# Patient Record
Sex: Female | Born: 1995 | Race: Asian | Hispanic: No | Marital: Single | Smoking: Never smoker
Health system: Southern US, Community
[De-identification: ages and names within clinical notes are randomized; demographics above are authoritative.]

## PROBLEM LIST (undated history)

## (undated) DIAGNOSIS — F32A Depression, unspecified: Secondary | ICD-10-CM

## (undated) DIAGNOSIS — J45909 Unspecified asthma, uncomplicated: Secondary | ICD-10-CM

## (undated) DIAGNOSIS — F419 Anxiety disorder, unspecified: Secondary | ICD-10-CM

## (undated) DIAGNOSIS — E282 Polycystic ovarian syndrome: Secondary | ICD-10-CM

## (undated) DIAGNOSIS — G4730 Sleep apnea, unspecified: Secondary | ICD-10-CM

## (undated) HISTORY — PX: HYMENECTOMY: SHX987

## (undated) HISTORY — DX: Unspecified asthma, uncomplicated: J45.909

## (undated) HISTORY — DX: Sleep apnea, unspecified: G47.30

## (undated) HISTORY — PX: WISDOM TOOTH EXTRACTION: SHX21

---
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
## 2020-01-11 ENCOUNTER — Ambulatory Visit: Payer: Self-pay

## 2020-01-11 ENCOUNTER — Ambulatory Visit
Admission: RE | Admit: 2020-01-11 | Discharge: 2020-01-11 | Disposition: A | Discharge status: Home / Self Care | Payer: Managed Care, Other (non HMO) | Source: Ambulatory Visit

## 2020-01-11 ENCOUNTER — Other Ambulatory Visit: Payer: Self-pay

## 2020-01-11 VITALS — BP 133/93 | HR 98 | Temp 98.3°F | Resp 16

## 2020-01-11 DIAGNOSIS — R21 Rash and other nonspecific skin eruption: Secondary | ICD-10-CM | POA: Diagnosis not present

## 2020-01-11 HISTORY — DX: Polycystic ovarian syndrome: E28.2

## 2020-01-11 MED ORDER — HYDROXYZINE HCL 25 MG PO TABS
25.0000 mg | ORAL_TABLET | Freq: Four times a day (QID) | ORAL | 0 refills | Status: DC
Start: 2020-01-11 — End: 2020-04-05

## 2020-01-11 MED ORDER — HYDROXYZINE HCL 25 MG PO TABS
25.0000 mg | ORAL_TABLET | Freq: Four times a day (QID) | ORAL | 0 refills | Status: DC
Start: 2020-01-11 — End: 2020-01-11

## 2020-01-11 NOTE — Discharge Instructions (Signed)
Continue prednisone as directed. Hydroxyzine as directed. Continue to monitor symptoms. If swelling to the lips worsen, have trouble breathing, go to the ED for further evaluation.

## 2020-01-11 NOTE — ED Provider Notes (Signed)
[facility]    CSN: [id number] Arrival date & time: 01/11/20  1745      History   Chief Complaint Chief Complaint  Patient presents with  . Rash    HPI Carol Manning is a 24 y.o. female.   24 year old female comes in for 1 day history of rash.  This for started to the back, itching and sensation.  Also noted lip swelling that has since improved.  Denies any pain, burning, erythema, warmth.  Denies fever, chills, body aches.  Denies URI symptoms, joint pain, nausea, vomiting, tick bites.  Patient states to the e-visit, was given prednisone pack.  However, since starting prednisone, noticed rash to the face, and therefore came in for evaluation.  Denies swelling of the throat, tripoding, drooling, trismus.  Denies shortness of breath. No obvious new exposures.      Past Medical History:  Diagnosis Date  . PCOS (polycystic ovarian syndrome)     There are no problems to display for this patient.   History reviewed. No pertinent surgical history.  OB History   No obstetric history on file.      Home Medications    Prior to Admission medications   Medication Sig Start Date End Date Taking? Authorizing Provider  drospirenone-ethinyl estradiol (YAZ) 3-0.02 MG tablet Take 1 tablet by mouth daily.   Yes [provider]  FLUoxetine (PROZAC) 20 MG tablet Take 20 mg by mouth daily.   Yes [provider]  metFORMIN (GLUCOPHAGE) 500 MG tablet Take 500 mg by mouth 2 (two) times daily with a meal.   Yes [provider]  predniSONE (STERAPRED UNI-PAK 21 TAB) 10 MG (21) TBPK tablet Take 10 mg by mouth daily.   Yes [provider]  hydrOXYzine (ATARAX/VISTARIL) 25 MG tablet Take 1 tablet (25 mg total) by mouth every 6 (six) hours. 01/11/20   Belinda Fisher, PA-C    Family History Family History  Adopted: Yes    Social History Social History   Tobacco Use  . Smoking status: Never Smoker  . Smokeless tobacco: Never Used  Vaping Use  .  Vaping Use: Never used  Substance Use Topics  . Alcohol use: Never  . Drug use: Never     Allergies   Patient has no known allergies.   Review of Systems Review of Systems  Reason unable to perform ROS: See HPI as above.     Physical Exam Triage Vital Signs ED Triage Vitals [01/11/20 1759]  Enc Vitals Group     BP (!) 133/93     Pulse Rate 98     Resp 16     Temp 98.3 F (36.8 C)     Temp Source Oral     SpO2 96 %     Weight      Height      Head Circumference      Peak Flow      Pain Score      Pain Loc      Pain Edu?      Excl. in GC?    No data found.  Updated Vital Signs BP (!) 133/93 (BP Location: Left Arm)   Pulse 98   Temp 98.3 F (36.8 C) (Oral)   Resp 16   LMP 07/07/2019 (Approximate) Comment: continuous bcp  SpO2 96%   Physical Exam Constitutional:      General: She is not in acute distress.    Appearance: Normal appearance. She is well-developed. She is not  toxic-appearing or diaphoretic.  HENT:     Head: Normocephalic and atraumatic.     Mouth/Throat:     Comments: Slight upper lip swelling.  Patient handling own secretions well. Eyes:     Conjunctiva/sclera: Conjunctivae normal.     Pupils: Pupils are equal, round, and reactive to light.  Pulmonary:     Effort: Pulmonary effort is normal. No respiratory distress.  Musculoskeletal:     Cervical back: Normal range of motion and neck supple.  Skin:    General: Skin is warm and dry.     Comments: Maculopapular rash diffusely of the lower back. No erythema, warmth.   Macular rash in slight circular pattern with central clearing to the left cheek.   Neurological:     Mental Status: She is alert and oriented to person, place, and time.      UC Treatments / Results  Labs (all labs ordered are listed, but only abnormal results are displayed) Labs Reviewed - No data to display  EKG   Radiology No results found.  Procedures Procedures (including critical care time)  Medications  Ordered in UC Medications - No data to display  Initial Impression / Assessment and Plan / UC Course  I have reviewed the triage vital signs and the nursing notes.  Pertinent labs & imaging results that were available during my care of the patient were reviewed by me and considered in my medical decision making (see chart for details).    Patient to continue prednisone as directed.  We will add hydroxyzine for itching.  Return precautions given.  Patient expresses understanding and agrees to plan.  Final Clinical Impressions(s) / UC Diagnoses   Final diagnoses:  Rash and nonspecific skin eruption    ED Prescriptions    Medication Sig Dispense Auth. Provider   hydrOXYzine (ATARAX/VISTARIL) 25 MG tablet  (Status: Discontinued) Take 1 tablet (25 mg total) by mouth every 6 (six) hours. 12 tablet Benoit Meech V, PA-C   hydrOXYzine (ATARAX/VISTARIL) 25 MG tablet Take 1 tablet (25 mg total) by mouth every 6 (six) hours. 12 tablet Belinda Fisher, PA-C     PDMP not reviewed this encounter.   Belinda Fisher, PA-C 01/12/20 0800

## 2020-01-11 NOTE — ED Triage Notes (Signed)
Pt c/o rash to trunk, arms, legs acute onset this morning at approx 0230. Took benadryl and symptoms improved slightly, but rash began on face, here for re-eval. Denies SOB, difficulty swallowing, facial edema

## 2020-02-14 ENCOUNTER — Ambulatory Visit: Payer: Self-pay

## 2020-02-15 ENCOUNTER — Other Ambulatory Visit: Payer: Managed Care, Other (non HMO)

## 2020-02-15 ENCOUNTER — Other Ambulatory Visit: Payer: Self-pay

## 2020-02-15 DIAGNOSIS — Z20822 Contact with and (suspected) exposure to covid-19: Secondary | ICD-10-CM

## 2020-02-17 LAB — NOVEL CORONAVIRUS, NAA: SARS-CoV-2, NAA: NOT DETECTED

## 2020-02-17 LAB — SARS-COV-2, NAA 2 DAY TAT

## 2020-04-04 ENCOUNTER — Other Ambulatory Visit: Payer: Self-pay

## 2020-04-04 DIAGNOSIS — Z20822 Contact with and (suspected) exposure to covid-19: Secondary | ICD-10-CM | POA: Insufficient documentation

## 2020-04-04 DIAGNOSIS — F332 Major depressive disorder, recurrent severe without psychotic features: Secondary | ICD-10-CM | POA: Insufficient documentation

## 2020-04-05 ENCOUNTER — Other Ambulatory Visit: Payer: Self-pay

## 2020-04-05 ENCOUNTER — Encounter (HOSPITAL_COMMUNITY): Payer: Self-pay | Admitting: Emergency Medicine

## 2020-04-05 ENCOUNTER — Ambulatory Visit (HOSPITAL_COMMUNITY)
Admission: EM | Admit: 2020-04-05 | Discharge: 2020-04-05 | Disposition: A | Discharge status: Home / Self Care | Payer: 59 | Attending: Psychiatry | Admitting: Psychiatry

## 2020-04-05 DIAGNOSIS — Z20822 Contact with and (suspected) exposure to covid-19: Secondary | ICD-10-CM | POA: Diagnosis not present

## 2020-04-05 DIAGNOSIS — F411 Generalized anxiety disorder: Secondary | ICD-10-CM

## 2020-04-05 DIAGNOSIS — F332 Major depressive disorder, recurrent severe without psychotic features: Secondary | ICD-10-CM

## 2020-04-05 HISTORY — DX: Depression, unspecified: F32.A

## 2020-04-05 HISTORY — DX: Anxiety disorder, unspecified: F41.9

## 2020-04-05 LAB — POCT URINE DRUG SCREEN - MANUAL ENTRY (I-SCREEN)
POC Amphetamine UR: NOT DETECTED
POC Buprenorphine (BUP): NOT DETECTED
POC Cocaine UR: NOT DETECTED
POC Marijuana UR: NOT DETECTED
POC Methadone UR: NOT DETECTED
POC Methamphetamine UR: NOT DETECTED
POC Morphine: NOT DETECTED
POC Oxazepam (BZO): NOT DETECTED
POC Oxycodone UR: NOT DETECTED
POC Secobarbital (BAR): NOT DETECTED

## 2020-04-05 LAB — CBC WITH DIFFERENTIAL/PLATELET
Abs Immature Granulocytes: 0.06 10*3/uL (ref 0.00–0.07)
Basophils Absolute: 0.1 10*3/uL (ref 0.0–0.1)
Basophils Relative: 1 %
Eosinophils Absolute: 0.1 10*3/uL (ref 0.0–0.5)
Eosinophils Relative: 1 %
HCT: 42.3 % (ref 36.0–46.0)
Hemoglobin: 14 g/dL (ref 12.0–15.0)
Immature Granulocytes: 1 %
Lymphocytes Relative: 36 %
Lymphs Abs: 3.6 10*3/uL (ref 0.7–4.0)
MCH: 30.2 pg (ref 26.0–34.0)
MCHC: 33.1 g/dL (ref 30.0–36.0)
MCV: 91.4 fL (ref 80.0–100.0)
Monocytes Absolute: 0.8 10*3/uL (ref 0.1–1.0)
Monocytes Relative: 8 %
Neutro Abs: 5.4 10*3/uL (ref 1.7–7.7)
Neutrophils Relative %: 53 %
Platelets: 390 10*3/uL (ref 150–400)
RBC: 4.63 MIL/uL (ref 3.87–5.11)
RDW: 12.7 % (ref 11.5–15.5)
WBC: 10 10*3/uL (ref 4.0–10.5)
nRBC: 0 % (ref 0.0–0.2)

## 2020-04-05 LAB — COMPREHENSIVE METABOLIC PANEL
ALT: 25 U/L (ref 0–44)
AST: 22 U/L (ref 15–41)
Albumin: 3.5 g/dL (ref 3.5–5.0)
Alkaline Phosphatase: 62 U/L (ref 38–126)
Anion gap: 12 (ref 5–15)
BUN: 9 mg/dL (ref 6–20)
CO2: 22 mmol/L (ref 22–32)
Calcium: 9 mg/dL (ref 8.9–10.3)
Chloride: 104 mmol/L (ref 98–111)
Creatinine, Ser: 0.66 mg/dL (ref 0.44–1.00)
GFR, Estimated: >60 mL/min (ref >60)
Glucose, Bld: 102 mg/dL — ABNORMAL HIGH (ref 70–99)
Potassium: 3.6 mmol/L (ref 3.5–5.1)
Sodium: 138 mmol/L (ref 135–145)
Total Bilirubin: 0.3 mg/dL (ref 0.3–1.2)
Total Protein: 7.2 g/dL (ref 6.5–8.1)

## 2020-04-05 LAB — POC SARS CORONAVIRUS 2 AG -  ED: SARS Coronavirus 2 Ag: NEGATIVE

## 2020-04-05 LAB — RESPIRATORY PANEL BY RT PCR (FLU A&B, COVID)
Influenza A by PCR: NEGATIVE
Influenza B by PCR: NEGATIVE
SARS Coronavirus 2 by RT PCR: NEGATIVE

## 2020-04-05 LAB — POCT PREGNANCY, URINE: Preg Test, Ur: NEGATIVE

## 2020-04-05 LAB — POC SARS CORONAVIRUS 2 AG: SARS Coronavirus 2 Ag: NEGATIVE

## 2020-04-05 MED ORDER — MELATONIN 5 MG PO TABS
5.0000 mg | ORAL_TABLET | Freq: Every evening | ORAL | Status: DC | PRN
  Administered 2020-04-05: 5 mg via ORAL
  Filled 2020-04-05: qty 1

## 2020-04-05 MED ORDER — MAGNESIUM HYDROXIDE 400 MG/5ML PO SUSP: 30.0000 mL | Freq: Every day | ORAL | Status: DC | PRN

## 2020-04-05 MED ORDER — ESCITALOPRAM OXALATE 10 MG PO TABS: 10.0000 mg | ORAL_TABLET | Freq: Every day | ORAL | 0 refills | Status: DC

## 2020-04-05 MED ORDER — HYDROXYZINE HCL 25 MG PO TABS: 25.0000 mg | ORAL_TABLET | Freq: Three times a day (TID) | ORAL | Status: DC | PRN

## 2020-04-05 MED ORDER — ESCITALOPRAM OXALATE 10 MG PO TABS
10.0000 mg | ORAL_TABLET | Freq: Every day | ORAL | Status: DC
  Administered 2020-04-05: 10 mg via ORAL
  Filled 2020-04-05: qty 1

## 2020-04-05 MED ORDER — ESCITALOPRAM OXALATE 5 MG PO TABS
5.0000 mg | ORAL_TABLET | Freq: Every day | ORAL | Status: DC
  Administered 2020-04-05: 5 mg via ORAL
  Filled 2020-04-05: qty 1

## 2020-04-05 MED ORDER — ACETAMINOPHEN 325 MG PO TABS: 650.0000 mg | ORAL_TABLET | Freq: Four times a day (QID) | ORAL | Status: DC | PRN

## 2020-04-05 MED ORDER — ALUM & MAG HYDROXIDE-SIMETH 200-200-20 MG/5ML PO SUSP: 30.0000 mL | ORAL | Status: DC | PRN

## 2020-04-05 MED ORDER — DROSPIRENONE-ETHINYL ESTRADIOL 3-0.02 MG PO TABS
1.0000 | ORAL_TABLET | Freq: Every day | ORAL | Status: DC
  Administered 2020-04-05: 1 via ORAL

## 2020-04-05 MED ORDER — METFORMIN HCL 500 MG PO TABS
500.0000 mg | ORAL_TABLET | Freq: Two times a day (BID) | ORAL | Status: DC
  Administered 2020-04-05: 500 mg via ORAL
  Filled 2020-04-05: qty 1

## 2020-04-05 NOTE — ED Provider Notes (Signed)
Behavioral Health Admission [facility])  Date: 04/05/20 Patient Name: Carol Manning MRN: [id number] Chief Complaint:  Chief Complaint  Patient presents with  . Suicidal   Chief Complaint/Presenting Problem: suicidal thoughts, anxiety (suicidal thoughts, anxiety)  Diagnoses:  Final diagnoses:  Severe recurrent major depression without psychotic features (HCC)  GAD (generalized anxiety disorder)    HPI: Carol Manning is a 24 y.o. with a history of depression and anxiety who presents to [facility] voluntarily due to intrusive thoughts of running through a red light or hanging herself. She reports chronic suicidal thoughts. She states they have worsened over the past two weeks. She denies HI, AVH and SIB. She reports prior suicide attempt by overdose during childhood. States that she was seen in the Ed after that attempt and allowed to go home under her parents' supervision. She denies a history of inpatient psychiatric admission. Patientstates she also is depressed experiencing symptoms such as anxiety, irritability, worthlessness, hopelessness, . Pt reports poor sleep, states it varies and her appetite is poor states she has been overeating and has gained 10 pounds in the last month.  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 States this has worsened during the pandemic. She states that she is adopted and is not aware of her biological family history. She also reports adoption and not knowing her biological family as a trigger for depression and anxiety. She reports no current psychiatric provider and states PCP prescribes medications. She states that she has an appointment next Friday with a psychiatric provider but doe snot feel that she could wait that long to be seen. Patient reports that she is taking prozac 20 mg daily. States that she has taken higher does but it made  her feel "numb." She feels that the Prozac is not working and would like to try a different medication. States that she took Zoloft as an adolescent and had auditory hallucinations after starting.   PHQ 2-9:     Total Time spent with patient: 20 minutes  Musculoskeletal  Strength & Muscle Tone: within normal limits Gait & Station: normal Patient leans: N/A  Psychiatric Specialty Exam  Presentation General Appearance: Appropriate for Environment;Well Groomed  Eye Contact:Good  Speech:Clear and Coherent  Speech Volume:Normal  Handedness:No data recorded  Mood and Affect  Mood:Anxious;Depressed  Affect:Congruent   Thought Process  Thought Processes:Coherent;Goal Directed;Linear  Descriptions of Associations:Intact  Orientation:Full (Time, Place and Person)  Thought Content:WDL  Hallucinations:Hallucinations: None  Ideas of Reference:None  Suicidal Thoughts:Suicidal Thoughts: Yes, Active SI Active Intent and/or Plan: With Intent;With Plan;With Means to Carry Out  Homicidal Thoughts:Homicidal Thoughts: No   Sensorium  Memory:Immediate Good;Recent Good;Remote Good  Judgment:Intact  Insight:Fair   Executive Functions  Concentration:Good  Attention Span:Good  Recall:Good  Fund of Knowledge:Good  Language:Good   Psychomotor Activity  Psychomotor Activity:Psychomotor Activity: Normal   Assets  Assets:Desire for Improvement;Communication Skills;Financial Resources/Insurance;Physical Health;Housing;Transportation;Vocational/Educational;Resilience   Sleep  Sleep:Sleep: Fair   Physical Exam Constitutional:      General: She is not in acute distress.    Appearance: She is not ill-appearing, toxic-appearing or diaphoretic.  HENT:     Head: Normocephalic.     Right Ear: External ear normal.     Left Ear: External ear normal.  Eyes:     Conjunctiva/sclera: Conjunctivae normal.     Pupils: Pupils are equal, round, and  reactive to light.   Cardiovascular:     Rate and Rhythm: Normal rate.  Pulmonary:     Effort: Pulmonary effort is normal. No respiratory distress.  Musculoskeletal:        General: Normal range of motion.  Skin:    General: Skin is warm and dry.  Neurological:     Mental Status: She is alert and oriented to person, place, and time.  Psychiatric:        Mood and Affect: Mood is anxious and depressed.        Thought Content: Thought content is not paranoid or delusional. Thought content includes suicidal ideation. Thought content does not include homicidal ideation. Thought content includes suicidal plan.    Review of Systems  Constitutional: Negative for chills, diaphoresis, fever, malaise/fatigue and weight loss.  HENT: Negative for congestion.   Respiratory: Negative for cough and shortness of breath.   Cardiovascular: Negative for chest pain and palpitations.  Gastrointestinal: Negative for diarrhea, nausea and vomiting.  Neurological: Negative for dizziness and seizures.  Psychiatric/Behavioral: Positive for depression and suicidal ideas. Negative for hallucinations, memory loss and substance abuse. The patient is nervous/anxious and has insomnia.   All other systems reviewed and are negative.   Blood pressure (!) 143/100, pulse (!) 109, temperature 98.7 F (37.1 C), temperature source Oral, resp. rate 20, weight 219 lb (99.3 kg), SpO2 98 %. There is no height or weight on file to calculate BMI.  Past Psychiatric History: Depression, Anxiety  Is the patient at risk to self? Yes  Has the patient been a risk to self in the past 6 months? No .    Has the patient been a risk to self within the distant past? Yes   Is the patient a risk to others? No   Has the patient been a risk to others in the past 6 months? No   Has the patient been a risk to others within the distant past? No   Past Medical History:  Past Medical History:  Diagnosis Date  . Anxiety   . Depression   . PCOS (polycystic  ovarian syndrome)    No past surgical history on file.  Family History:  Family History  Adopted: Yes    Social History:  Social History   Socioeconomic History  . Marital status: Single    Spouse name: Not on file  . Number of children: Not on file  . Years of education: Not on file  . Highest education level: Not on file  Occupational History  . Not on file  Tobacco Use  . Smoking status: Never Smoker  . Smokeless tobacco: Never Used  Vaping Use  . Vaping Use: Never used  Substance and Sexual Activity  . Alcohol use: Never  . Drug use: Never  . Sexual activity: Never  Other Topics Concern  . Not on file  Social History Narrative  . Not on file   Social Determinants of Health   Financial Resource Strain:   . Difficulty of Paying Living Expenses: Not on file  Food Insecurity:   . Worried About Programme researcher, broadcasting/film/video in the Last Year: Not on file  . Ran Out of Food in the Last Year: Not on file  Transportation Needs:   . Lack of Transportation (Medical): Not on file  . Lack of Transportation (Non-Medical): Not on file  Physical Activity:   . Days of Exercise per Week: Not on file  . Minutes of Exercise per Session: Not on  file  Stress:   . Feeling of Stress : Not on file  Social Connections:   . Frequency of Communication with Friends and Family: Not on file  . Frequency of Social Gatherings with Friends and Family: Not on file  . Attends Religious Services: Not on file  . Active Member of Clubs or Organizations: Not on file  . Attends BankerClub or Organization Meetings: Not on file  . Marital Status: Not on file  Intimate Partner Violence:   . Fear of Current or Ex-Partner: Not on file  . Emotionally Abused: Not on file  . Physically Abused: Not on file  . Sexually Abused: Not on file    SDOH:  SDOH Screenings   Alcohol Screen:   . Last Alcohol Screening Score (AUDIT): Not on file  Depression (PHQ2-9):   . PHQ-2 Score: Not on file  Financial Resource  Strain:   . Difficulty of Paying Living Expenses: Not on file  Food Insecurity:   . Worried About Programme researcher, broadcasting/film/videounning Out of Food in the Last Year: Not on file  . Ran Out of Food in the Last Year: Not on file  Housing:   . Last Housing Risk Score: Not on file  Physical Activity:   . Days of Exercise per Week: Not on file  . Minutes of Exercise per Session: Not on file  Social Connections:   . Frequency of Communication with Friends and Family: Not on file  . Frequency of Social Gatherings with Friends and Family: Not on file  . Attends Religious Services: Not on file  . Active Member of Clubs or Organizations: Not on file  . Attends BankerClub or Organization Meetings: Not on file  . Marital Status: Not on file  Stress:   . Feeling of Stress : Not on file  Tobacco Use: Low Risk   . Smoking Tobacco Use: Never Smoker  . Smokeless Tobacco Use: Never Used  Transportation Needs:   . Freight forwarderLack of Transportation (Medical): Not on file  . Lack of Transportation (Non-Medical): Not on file    Last Labs:  Admission on 04/05/2020  Component Date Value Ref Range Status  . POC Amphetamine UR 04/05/2020 None Detected  None Detected Preliminary  . POC Secobarbital (BAR) 04/05/2020 None Detected  None Detected Preliminary  . POC Buprenorphine (BUP) 04/05/2020 None Detected  None Detected Preliminary  . POC Oxazepam (BZO) 04/05/2020 None Detected  None Detected Preliminary  . POC Cocaine UR 04/05/2020 None Detected  None Detected Preliminary  . POC Methamphetamine UR 04/05/2020 None Detected  None Detected Preliminary  . POC Morphine 04/05/2020 None Detected  None Detected Preliminary  . POC Oxycodone UR 04/05/2020 None Detected  None Detected Preliminary  . POC Methadone UR 04/05/2020 None Detected  None Detected Preliminary  . POC Marijuana UR 04/05/2020 None Detected  None Detected Preliminary  . SARS Coronavirus 2 Ag 04/05/2020 Negative  Negative Preliminary  . SARS Coronavirus 2 Ag 04/05/2020 NEGATIVE  NEGATIVE  Final   Comment: (NOTE) SARS-CoV-2 antigen NOT DETECTED.   Negative results are presumptive.  Negative results do not preclude SARS-CoV-2 infection and should not be used as the sole basis for treatment or other patient management decisions, including infection  control decisions, particularly in the presence of clinical signs and  symptoms consistent with COVID-19, or in those who have been in contact with the virus.  Negative results must be combined with clinical observations, patient history, and epidemiological information. The expected result is Negative.  Fact Sheet for Patients: https://sanders-williams.net/https://www.fda.gov/media/139754/download  Fact Sheet for Healthcare Providers: https://martinez.com/   This test is not yet approved or cleared by the [place] FDA and  has been authorized for detection and/or diagnosis of SARS-CoV-2 by FDA under an Emergency Use Authorization (EUA).  This EUA will remain in effect (meaning this test can be used) for the duration of  the C                          OVID-19 declaration under Section 564(b)(1) of the Act, 21 U.S.C. section 360bbb-3(b)(1), unless the authorization is terminated or revoked sooner.    . Preg Test, Ur 04/05/2020 NEGATIVE  NEGATIVE Final   Comment:        THE SENSITIVITY OF THIS METHODOLOGY IS >24 mIU/mL   Orders Only on 02/15/2020  Component Date Value Ref Range Status  . SARS-CoV-2, NAA 02/15/2020 Not Detected  Not Detected Final   Comment: This nucleic acid amplification test was developed and its performance characteristics determined by [employer]. Nucleic acid amplification tests include RT-PCR and TMA. This test has not been FDA cleared or approved. This test has been authorized by FDA under an Emergency Use Authorization (EUA). This test is only authorized for the duration of time the declaration that circumstances exist justifying the authorization of the emergency use of in  vitro diagnostic tests for detection of SARS-CoV-2 virus and/or diagnosis of COVID-19 infection under section 564(b)(1) of the Act, 21 U.S.C. 841YSA-6(T) (1), unless the authorization is terminated or revoked sooner. When diagnostic testing is negative, the possibility of a false negative result should be considered in the context of a patient's recent exposures and the presence of clinical signs and symptoms consistent with COVID-19. An individual without symptoms of COVID-19 and who is not shedding SARS-CoV-2 virus wo                          uld expect to have a negative (not detected) result in this assay.   Marland Kitchen SARS-CoV-2, NAA 2 DAY TAT 02/15/2020 Performed   Final    Allergies: Patient has no known allergies.  PTA Medications: (Not in a hospital admission)   Medical Decision Making  Admission labs ordered  Discussed risk/benefits and side effects of lexapro. Patient in agreement with plan.  Start lexapro 5 mg daily for depression/anxiety, first dose now Continue hydroxyzine 25 mg TID prn for anxiety Continue metformin 500 mg BID for PCOS    Recommendations  Based on my evaluation the patient does not appear to have an emergency medical condition.   Patient will be placed in the continuous assessment area at [facility] for treatment and stabilization. She will be reevaluated on 04/05/2020. The treatment team will determine disposition at that time.      Jackelyn Poling, NP 04/05/20  2:09 AM

## 2020-04-05 NOTE — Discharge Instructions (Signed)
Lexapro brlings to a class of medications called SSRIs, (selective serotonin reuptake inhibitors). This class of medication can have side effects such as weight gain, sexual dysfunction, insomnia, headache, nausea. These medications are generally effective at alleviating symptoms of anxiety and/or depression. Please alert your outpatient psychiatrist if you experience any side effects.  Please follow up with out outpatient provider next Friday, you will be provided with a prescription to last until that day.

## 2020-04-05 NOTE — ED Notes (Signed)
Pt sitting up on bed. Denies concerns. Denies SI/HI. Pt states, "I feel a little better than I did last night. At least I don't have plans to hurt myself this morning". Support and praise given. Informed pt to notify staff with any impulses/urges to harm self. Pt verbalized agreement. Safety maintained.

## 2020-04-05 NOTE — ED Notes (Signed)
Pt sleeping@this time.breathing even and unlabored. Will continue to monitor pt for safety 

## 2020-04-05 NOTE — ED Triage Notes (Signed)
Presents with suicidal thoughts, plan to drive through red light in traffic.  Denies HI or AVH.

## 2020-04-05 NOTE — ED Notes (Signed)
Patient A&O x 4, ambulatory. Patient discharged in no acute distress. Patient denied SI/HI, A/VH upon discharge. Patient verbalized understanding of all discharge instructions explained by staff, to include follow up appointments, RX's and safety plan. Pt belongings returned to patient from locker #31 intact. Patient escorted to lobby via staff for self transport to home. Safety maintained.

## 2020-04-05 NOTE — ED Notes (Signed)
Pt resting with eyes closed in no acute distress. Safety maintained. 

## 2020-04-05 NOTE — ED Notes (Signed)
Pt on phone with mother. No acute distress noted. Safety maintained.

## 2020-04-05 NOTE — ED Provider Notes (Signed)
FBC/OBS ASAP Discharge Summary  Date and Time: 04/05/2020 9:32 AM  Name: Carol Manning  MRN:  [id number]   Discharge Diagnoses:  Final diagnoses:  Severe recurrent major depression without psychotic features (HCC)  GAD (generalized anxiety disorder)    Subjective: Patient interviewed this morning in observation. She is found laying In bed in NAD. She is calm, cooperative and pleasant. She states that she came into the [facility] yesterday because she was having "visions of killing myself" and states that she has been passively suicidal since she was in the 7th grade. She states that she was adopted and has a fear of abadonement but is also still grieving the loss of her family and her culture as an asian [place] since her adopted family is caucasion. 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 She states that she has a good support system in her coworkers as they can relate to one another and feels comfortable talking about stressors with them. She states that her mood is "a bit better" today and denies SI, plan or intent. Denies HI/AVH. She requests letter for work that also reveals that she had a negative covid test.She reports no SE/AE to lexapro and is ameanable to increasing the dose. She states that she will follow up with her psychiatrist next Friday for further management. She expresses interest in reestablishing with a therapist, she states that she had a therapist for many years but that the most recent one was not a good fit. She states that she will discuss this in her appointment on Friday with her psychiatrist to see if she is able to see someone available at the same facility.  Stay Summary:  Carol Manning is a 24 y.o. with a history of depression and  anxiety who presents to [facility] voluntarily on the morning of 04/05/2020 due to intrusive thoughts of running through a red light or hanging herself. She reports chronic suicidal thoughts. She states they have worsened over the past two weeks. She denies HI, AVH and SIB. She reports prior suicide attempt by overdose during childhood. States that she was seen in the Ed after that attempt and allowed to go home under her parents' supervision. She denies a history of inpatient psychiatric admission. Patientstates she also is depressed experiencing symptoms such as anxiety, irritability, worthlessness, hopelessness, . Pt reports poor sleep, states it varies and her appetite is poor states she has been overeating and has gained 10 pounds in the last month. 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 States this has worsened during the pandemic. She states that she is adopted and is not aware of her biological family history. She also reports adoption and not knowing her biological family as a trigger for depression and anxiety. She reports no current psychiatric provider and states PCP prescribes medications. She states that she has an appointment next Friday with a psychiatric provider but doe snot feel that she could wait that long to be seen. Patient reports that she is taking prozac 20 mg daily. States that she has taken higher does but it made her feel "  numb." She feels that the Prozac is not working and would like to try a different medication. States that she took Zoloft as an adolescent and had auditory hallucinations after starting. Patient was admitted for observation and was started on lexapro 5 mg. On assessment this AM pt denies SI/HI/AVH and feels safe for discharge (see above for detailed information). She tolerated lexapro well and was given rx to last until her appointment  next week with her psychiatrist.   Total Time spent with patient: 20 minutes  Past Psychiatric History: see H&P Past Medical History:  Past Medical History:  Diagnosis Date  . Anxiety   . Depression   . PCOS (polycystic ovarian syndrome)    No past surgical history on file. Family History:  Family History  Adopted: Yes   Family Psychiatric History: see H&P Social History:  Social History   Substance and Sexual Activity  Alcohol Use Never     Social History   Substance and Sexual Activity  Drug Use Never    Social History   Socioeconomic History  . Marital status: Single    Spouse name: Not on file  . Number of children: Not on file  . Years of education: Not on file  . Highest education level: Not on file  Occupational History  . Not on file  Tobacco Use  . Smoking status: Never Smoker  . Smokeless tobacco: Never Used  Vaping Use  . Vaping Use: Never used  Substance and Sexual Activity  . Alcohol use: Never  . Drug use: Never  . Sexual activity: Never  Other Topics Concern  . Not on file  Social History Narrative  . Not on file   Social Determinants of Health   Financial Resource Strain:   . Difficulty of Paying Living Expenses: Not on file  Food Insecurity:   . Worried About Programme researcher, broadcasting/film/video in the Last Year: Not on file  . Ran Out of Food in the Last Year: Not on file  Transportation Needs:   . Lack of Transportation (Medical): Not on file  . Lack of Transportation (Non-Medical): Not on file  Physical Activity:   . Days of Exercise per Week: Not on file  . Minutes of Exercise per Session: Not on file  Stress:   . Feeling of Stress : Not on file  Social Connections:   . Frequency of Communication with Friends and Family: Not on file  . Frequency of Social Gatherings with Friends and Family: Not on file  . Attends Religious Services: Not on file  . Active Member of Clubs or Organizations: Not on file  . Attends Banker Meetings:  Not on file  . Marital Status: Not on file   SDOH:  SDOH Screenings   Alcohol Screen:   . Last Alcohol Screening Score (AUDIT): Not on file  Depression (PHQ2-9):   . PHQ-2 Score: Not on file  Financial Resource Strain:   . Difficulty of Paying Living Expenses: Not on file  Food Insecurity:   . Worried About Programme researcher, broadcasting/film/video in the Last Year: Not on file  . Ran Out of Food in the Last Year: Not on file  Housing:   . Last Housing Risk Score: Not on file  Physical Activity:   . Days of Exercise per Week: Not on file  . Minutes of Exercise per Session: Not on file  Social Connections:   . Frequency of Communication with Friends and Family: Not on file  .  Frequency of Social Gatherings with Friends and Family: Not on file  . Attends Religious Services: Not on file  . Active Member of Clubs or Organizations: Not on file  . Attends BankerClub or Organization Meetings: Not on file  . Marital Status: Not on file  Stress:   . Feeling of Stress : Not on file  Tobacco Use: Low Risk   . Smoking Tobacco Use: Never Smoker  . Smokeless Tobacco Use: Never Used  Transportation Needs:   . Freight forwarderLack of Transportation (Medical): Not on file  . Lack of Transportation (Non-Medical): Not on file    Has this patient used any form of tobacco in the last 30 days? (Cigarettes, Smokeless Tobacco, Cigars, and/or Pipes) Prescription not provided because: n/a  Current Medications:  Current Facility-Administered Medications  Medication Dose Route Frequency Provider Last Rate Last Admin  . acetaminophen (TYLENOL) tablet 650 mg  650 mg Oral Q6H PRN Nira ConnBerry, Jason A, NP      . alum & mag hydroxide-simeth (MAALOX/MYLANTA) 200-200-20 MG/5ML suspension 30 mL  30 mL Oral Q4H PRN Nira ConnBerry, Jason A, NP      . drospirenone-ethinyl estradiol (YAZ) 3-0.02 MG per tablet 1 tablet  1 tablet Oral Daily Nira ConnBerry, Jason A, NP   1 tablet at 04/05/20 0107  . escitalopram (LEXAPRO) tablet 10 mg  10 mg Oral Daily Estella HuskLaubach, Elani Delph S, MD       . hydrOXYzine (ATARAX/VISTARIL) tablet 25 mg  25 mg Oral TID PRN Nira ConnBerry, Jason A, NP      . magnesium hydroxide (MILK OF MAGNESIA) suspension 30 mL  30 mL Oral Daily PRN Nira ConnBerry, Jason A, NP      . melatonin tablet 5 mg  5 mg Oral QHS PRN Nira ConnBerry, Jason A, NP   5 mg at 04/05/20 0110  . metFORMIN (GLUCOPHAGE) tablet 500 mg  500 mg Oral BID WC Jackelyn PolingBerry, Jason A, NP       Current Outpatient Medications  Medication Sig Dispense Refill  . drospirenone-ethinyl estradiol (YAZ) 3-0.02 MG tablet Take 1 tablet by mouth daily.    Marland Kitchen. FLUoxetine (PROZAC) 20 MG tablet Take 20 mg by mouth at bedtime.     . metFORMIN (GLUCOPHAGE) 500 MG tablet Take 500 mg by mouth 2 (two) times daily with a meal.      PTA Medications: (Not in a hospital admission)   Musculoskeletal  Strength & Muscle Tone: within normal limits Gait & Station: normal Patient leans: N/A  Psychiatric Specialty Exam  Presentation  General Appearance: Appropriate for Environment;Casual;Fairly Groomed  Eye Contact:Good  Speech:Clear and Coherent;Normal Rate  Speech Volume:Normal  Handedness:No data recorded  Mood and Affect  Mood:Anxious  Affect:Appropriate;Constricted   Thought Process  Thought Processes:Coherent;Goal Directed;Linear  Descriptions of Associations:Intact  Orientation:Full (Time, Place and Person)  Thought Content:WDL  Hallucinations:Hallucinations: None  Ideas of Reference:None  Suicidal Thoughts:Suicidal Thoughts: No SI Active Intent and/or Plan: With Intent;With Plan;With Means to Carry Out  Homicidal Thoughts:Homicidal Thoughts: No   Sensorium  Memory:Immediate Good;Recent Good;Remote Good  Judgment:Good  Insight:Good   Executive Functions  Concentration:Good  Attention Span:Good  Recall:Good  Fund of Knowledge:Good  Language:Good   Psychomotor Activity  Psychomotor Activity:Psychomotor Activity: Normal   Assets  Assets:Communication Skills;Desire for Improvement;Financial  Resources/Insurance;Physical Health;Resilience;Social Support;Vocational/Educational   Sleep  Sleep:Sleep: Fair   Physical Exam  Physical Exam Constitutional:      Appearance: Normal appearance.  HENT:     Head: Normocephalic and atraumatic.  Eyes:     Extraocular Movements: Extraocular movements intact.  Pulmonary:     Effort: Pulmonary effort is normal.  Neurological:     Mental Status: She is alert.    Review of Systems  Constitutional: Negative for chills and fever.  Respiratory: Negative for cough.   Cardiovascular: Negative for chest pain.  Psychiatric/Behavioral: Negative for suicidal ideas.   Blood pressure 126/89, pulse (!) 107, temperature 98.4 F (36.9 C), temperature source Oral, resp. rate 18, weight 99.3 kg, SpO2 100 %. There is no height or weight on file to calculate BMI.  Demographic Factors:  Living alone  Loss Factors: NA  Historical Factors: Prior suicide attempts and Impulsivity  Risk Reduction Factors:   Employed, Positive social support, Positive therapeutic relationship and Positive coping skills or problem solving skills  Continued Clinical Symptoms:  Previous Psychiatric Diagnoses and Treatments  Cognitive Features That Contribute To Risk:  None    Suicide Risk:  Minimal: No identifiable suicidal ideation.  Patients presenting with no risk factors but with morbid ruminations; may be classified as minimal risk based on the severity of the depressive symptoms  Plan Of Care/Follow-up recommendations:  Activity:  as tolerated Diet:  regular Other:    Patient is instructed prior to discharge to: Take all medications as prescribed by his/her mental healthcare provider. Report any adverse effects and or reactions from the medicines to his/her outpatient provider promptly. Patient has been instructed & cautioned: To not engage in alcohol and or illegal drug use while on prescription medicines. In the event of worsening symptoms, patient is  instructed to call the crisis hotline, 911 and or go to the nearest ED for appropriate evaluation and treatment of symptoms. To follow-up with his/her primary care provider for your other medical issues, concerns and or health care needs.     Disposition: home   Estella Husk, MD 04/05/2020, 9:32 AM

## 2020-04-05 NOTE — ED Notes (Signed)
Pt A&O x 4, presents with SI, plan to run a red light in traffic. Pt reports she can't get over these intrusive thoughts to harm herself and doesn't feel safe at home.  Denies previous attempts.  Denies HI or AVH.  Skin search completed, monitoring for safety, no distress noted, calm & cooperative.
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
## 2020-04-13 ENCOUNTER — Encounter (HOSPITAL_COMMUNITY): Payer: Self-pay | Admitting: Psychiatry

## 2020-04-13 ENCOUNTER — Telehealth (INDEPENDENT_AMBULATORY_CARE_PROVIDER_SITE_OTHER): Payer: 59 | Admitting: Psychiatry

## 2020-04-13 DIAGNOSIS — F411 Generalized anxiety disorder: Secondary | ICD-10-CM | POA: Diagnosis not present

## 2020-04-13 DIAGNOSIS — F331 Major depressive disorder, recurrent, moderate: Secondary | ICD-10-CM

## 2020-04-13 DIAGNOSIS — F502 Bulimia nervosa: Secondary | ICD-10-CM | POA: Diagnosis not present

## 2020-04-13 MED ORDER — ESCITALOPRAM OXALATE 10 MG PO TABS: 15.0000 mg | ORAL_TABLET | Freq: Every day | ORAL | 1 refills | Status: DC
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
## 2020-05-14 ENCOUNTER — Encounter (HOSPITAL_COMMUNITY): Payer: Self-pay | Admitting: Psychiatry

## 2020-05-14 ENCOUNTER — Telehealth (INDEPENDENT_AMBULATORY_CARE_PROVIDER_SITE_OTHER): Payer: 59 | Admitting: Psychiatry

## 2020-05-14 DIAGNOSIS — F502 Bulimia nervosa: Secondary | ICD-10-CM | POA: Diagnosis not present

## 2020-05-14 DIAGNOSIS — F331 Major depressive disorder, recurrent, moderate: Secondary | ICD-10-CM | POA: Diagnosis not present

## 2020-05-14 DIAGNOSIS — F411 Generalized anxiety disorder: Secondary | ICD-10-CM

## 2020-05-14 MED ORDER — ESCITALOPRAM OXALATE 20 MG PO TABS: 20.0000 mg | ORAL_TABLET | Freq: Every day | ORAL | 1 refills | Status: DC
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
## 2020-06-11 ENCOUNTER — Ambulatory Visit: Payer: Managed Care, Other (non HMO) | Admitting: Family

## 2020-06-11 ENCOUNTER — Telehealth: Payer: Self-pay | Admitting: Dermatology

## 2020-06-11 NOTE — Telephone Encounter (Signed)
Patient called to say that she was being referred to our office by her primary care physician, but that she no longer needs or wants the appointment.

## 2020-07-10 ENCOUNTER — Encounter (HOSPITAL_COMMUNITY): Payer: Self-pay | Admitting: Psychiatry

## 2020-07-10 ENCOUNTER — Telehealth (INDEPENDENT_AMBULATORY_CARE_PROVIDER_SITE_OTHER): Payer: 59 | Admitting: Psychiatry

## 2020-07-10 DIAGNOSIS — F331 Major depressive disorder, recurrent, moderate: Secondary | ICD-10-CM

## 2020-07-10 DIAGNOSIS — F411 Generalized anxiety disorder: Secondary | ICD-10-CM | POA: Diagnosis not present

## 2020-07-10 MED ORDER — ESCITALOPRAM OXALATE 20 MG PO TABS: 20.0000 mg | ORAL_TABLET | Freq: Every day | ORAL | 2 refills | Status: DC

## 2020-07-10 NOTE — Progress Notes (Signed)
[facility] Follow up visit   Patient Identification: Carol Manning MRN:  [id number] Date of Evaluation:  07/10/2020 Referral Source: Primary care, [facility] urgent care  Chief Complaint: follow up depression Visit Diagnosis:    ICD-10-CM   1. MDD (major depressive disorder), recurrent episode, moderate (HCC)  F33.1   2. GAD (generalized anxiety disorder)  F41.1    Virtual Visit via Video Note  I connected with [name] on 07/10/20 at  3:30 PM EST by a video enabled telemedicine application and verified that I am speaking with the correct person using two identifiers.  Location: Patient: home Provider: home office   I discussed the limitations of evaluation and management by telemedicine and the availability of in person appointments. The patient expressed understanding and agreed to proceed.      I discussed the assessment and treatment plan with the patient. The patient was provided an opportunity to ask questions and all were answered. The patient agreed with the plan and demonstrated an understanding of the instructions.   The patient was advised to call back or seek an in-person evaluation if the symptoms worsen or if the condition fails to improve as anticipated.  I provided 10  minutes of non-face-to-face time during this encounter.   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 Anxiety   . Depression   . PCOS (polycystic ovarian syndrome)    History reviewed. No pertinent surgical history.  Family Psychiatric History: denies, or not known, patient adapted  Family History:  Family History  Adopted: Yes    Social History:   Social History   Socioeconomic History  . Marital status: Single    Spouse name: Not on file  . Number of children: Not on file  . Years of education: Not on file  . Highest education level: Not on file  Occupational History  . Not on file  Tobacco Use  . Smoking status: Never Smoker  . Smokeless tobacco: Never Used  Vaping Use  . Vaping Use: Never used  Substance and Sexual Activity  . Alcohol use: Never  . Drug use: Never  . Sexual activity: Never  Other Topics Concern  . Not on file  Social History Narrative  . Not on file   Social Determinants of Health   Financial Resource Strain: Not on file  Food Insecurity: Not on file  Transportation Needs: Not on file  Physical Activity: Not on file  Stress: Not on file  Social Connections: Not on file      Allergies:  No Known Allergies  Metabolic Disorder Labs: No results found for: HGBA1C, MPG No results found for: PROLACTIN No results found for: CHOL, TRIG, HDL, CHOLHDL, VLDL, LDLCALC No results found  for: TSH  Therapeutic Level Labs: No results found for: LITHIUM No results found for: CBMZ No results found for: VALPROATE  Current Medications: Current Outpatient Medications  Medication Sig Dispense Refill  . drospirenone-ethinyl estradiol (YAZ) 3-0.02 MG tablet Take 1 tablet by mouth daily.    Marland Kitchen escitalopram (LEXAPRO) 20 MG tablet Take 1 tablet (20 mg total) by mouth daily for 7 days. 30 tablet 2  . metFORMIN (GLUCOPHAGE) 500 MG tablet Take 500 mg by mouth 2 (two) times daily with a  meal.     No current facility-administered medications for this visit.       Psychiatric Specialty Exam: Review of Systems  Cardiovascular: Negative for chest pain.  Psychiatric/Behavioral: Negative for agitation and dysphoric mood.    There were no vitals taken for this visit.There is no height or weight on file to calculate BMI.  General Appearance: Casual  Eye Contact:  Fair  Speech:  Normal Rate  Volume: normal  Mood: fair  Affect:  Congruent  Thought Process:  Goal Directed  Orientation:  Full (Time, Place, and Person)  Thought Content:  Rumination  Suicidal Thoughts:  No  Homicidal Thoughts:  No  Memory:  Immediate;   Fair Recent;   Fair  Judgement:  Fair  Insight:  Shallow  Psychomotor Activity:  Decreased  Concentration:  Concentration: Fair and Attention Span: Fair  Recall:  [employer] of Knowledge:Good  Language: Good  Akathisia:  No  Handed:    AIMS (if indicated):  No involuntary movements  Assets:  Communication Skills Desire for Improvement Physical Health  ADL's:  Intact  Cognition: WNL  Sleep:  Fair   Screenings:   Assessment and Plan: as follows MDD recurrent moderate to severe: better continue lexapro 20mg   Continue therapy her therapist Is Generalized anxiety disorder with panic attacks; manageable on med Continue lexapro  Bulimia; working in therapy to [place]  Discussed and reviewed medication called in early if needed Fu 70m. Renewed meds   1m, MD 2/8/20223:43 PM

## 2020-07-11 ENCOUNTER — Other Ambulatory Visit: Payer: Self-pay

## 2020-07-11 ENCOUNTER — Encounter: Payer: Self-pay | Admitting: Internal Medicine

## 2020-07-11 ENCOUNTER — Ambulatory Visit: Payer: Managed Care, Other (non HMO) | Admitting: Internal Medicine

## 2020-07-11 VITALS — BP 124/78 | HR 97 | Temp 97.6°F | Resp 18 | Ht 62.0 in | Wt 242.6 lb

## 2020-07-11 DIAGNOSIS — Z0000 Encounter for general adult medical examination without abnormal findings: Secondary | ICD-10-CM | POA: Diagnosis not present

## 2020-07-11 DIAGNOSIS — R197 Diarrhea, unspecified: Secondary | ICD-10-CM

## 2020-07-11 DIAGNOSIS — K625 Hemorrhage of anus and rectum: Secondary | ICD-10-CM | POA: Diagnosis not present

## 2020-07-11 DIAGNOSIS — K6389 Other specified diseases of intestine: Secondary | ICD-10-CM | POA: Diagnosis not present

## 2020-07-11 DIAGNOSIS — Z0001 Encounter for general adult medical examination with abnormal findings: Secondary | ICD-10-CM | POA: Insufficient documentation

## 2020-07-11 MED ORDER — DIPHENOXYLATE-ATROPINE 2.5-0.025 MG PO TABS: 1.0000 | ORAL_TABLET | Freq: Four times a day (QID) | ORAL | 2 refills | Status: DC | PRN

## 2020-07-11 NOTE — Patient Instructions (Signed)
Chronic Diarrhea Chronic diarrhea is a condition in which a person passes frequent loose and watery stools for 4 weeks or longer. Non-chronic diarrhea usually lasts for only 2-3 days. Diarrhea can cause a person to feel weak and dehydrated. Dehydration can make the person tired and thirsty. It can also cause a dry mouth, decreased urination, and dark yellow urine. Diarrhea is a sign of an underlying problem, such as:  Infection.  Side effects of medicines.  Problems digesting something in your diet, such as milk products if you have lactose intolerance.  Conditions such as celiac disease, irritable bowel syndrome (IBS), or inflammatory bowel disease (IBD). If you have chronic diarrhea, make sure you treat it as told by your health care provider. Follow these instructions at home: Medicines  Take over-the-counter and prescription medicines only as told by your health care provider.  If you were prescribed an antibiotic medicine, take it as told by your health care provider. Do not stop taking the antibiotic even if you start to feel better. Eating and drinking  Follow instructions from your health care provider about what to eat and drink. You may have to: ? Avoid foods that trigger diarrhea for you. ? Take an oral rehydration solution (ORS). This is a drink that keeps you hydrated. It can be found at pharmacies and retail stores. ? Drink clear fluids, such as water, diluted fruit juice, and low-calorie sports drinks. You can also get fluids by sucking on ice chips. ? Drink enough fluid to keep your urine pale yellow. This will help you avoid dehydration. ? Eat small amounts of bland foods that are easy to digest as you are able. These foods include bananas, applesauce, rice, lean meats, toast, and crackers. ? Avoid spicy or fatty foods. ? Avoid foods and beverages that contain a lot of sugar or caffeine.  Do not drink alcohol if: ? Your health care provider tells you not to  drink. ? You are pregnant, may be pregnant, or are planning to become pregnant.  If you drink alcohol: ? Limit how much you use to:  0-1 drink a day for women.  0-2 drinks a day for men. ? Be aware of how much alcohol is in your drink. In the [place] one drink equals one 12 oz bottle of beer (355 mL), one 5 oz glass of wine (148 mL), or one 1 oz glass of hard liquor (44 mL).   General instructions  Wash your hands often and after each diarrhea episode. Use soap and water. If soap and water are not available, use hand sanitizer.  Make sure that all people in your household wash their hands well and often.  Rest as told by your health care provider.  Watch your condition for any changes.  Take a warm bath to relieve any burning or pain from frequent diarrhea episodes.  Keep all follow-up visits as told by your health care provider. This is important.   Contact a health care provider if:  You have a fever.  Your diarrhea gets worse or does not get better.  You have new symptoms.  You cannot drink fluid without vomiting.  You feel light-headed or dizzy.  You have a headache.  You have muscle cramps.  You have severe pain in the rectum. Get help right away if:  You have vomiting that does not go away.  You have chest pain.  You feel very weak or you faint.  You have bloody or black stools, or stools that look   like tar.  You have severe pain, cramping, or bloating in your abdomen, or pain that stays in one place.  You have trouble breathing or you are breathing very quickly.  Your heart is beating very quickly.  Your skin feels cold and clammy.  You feel confused.  You have a severe headache.  You have signs or symptoms of dehydration, such as: ? Dark urine, very little urine, or no urine. ? Cracked lips. ? Dry mouth. ? Sunken eyes. ? Sleepiness. ? Weakness. These symptoms may represent a serious problem that is an emergency. Do not wait to see if the  symptoms will go away. Get medical help right away. Call your local emergency services (911 in the [place] Do not drive yourself to the hospital. Summary  Chronic diarrhea is a condition in which a person passes frequent loose and watery stools for 4 weeks or longer.  Diarrhea is a sign of an underlying problem.  Make sure you treat your diarrhea as told by your health care provider.  Drink enough fluid to keep your urine pale yellow. This will help you avoid dehydration.  Wash your hands often and after each diarrhea episode. If soap and water are not available, use hand sanitizer. This information is not intended to replace advice given to you by your health care provider. Make sure you discuss any questions you have with your health care provider. Document Revised: 11/15/2018 Document Reviewed: 11/15/2018 Elsevier Patient Education  2021 [employer]vier Inc.  

## 2020-07-11 NOTE — Progress Notes (Signed)
Subjective:  Patient ID: Carol Manning, female    DOB: 1996-03-10  Age: 25 y.o. MRN: [id number]  CC: New Patient (Initial Visit) (Rm 3. For the last year she has been having issues with extreme diarrhea. Her recently she has noticed blood and mucus in her stool. ) and Annual Exam    HPI Carol Manning presents for a CPX and to establish.  She complains of a 1 year history of diarrhea that has recently been blood-tinged.  She has at least 6 or 8 watery bowel movements a day.  She gets minimal relief with Imodium.  She describes a burning/crampy sensation in the pit of her stomach and stool urgency.  She has mild nausea but never vomits.  She has a good appetite and is not losing weight.  She tells me she does not think Metformin causes this.  She has avoided dairy products with no improvement in the diarrhea.  History Carol Manning has a past medical history of Anxiety, Depression, and PCOS (polycystic ovarian syndrome).   She has no past surgical history on file.   Her She was adopted. Family history is unknown by patient.She reports that she has never smoked. She has never used smokeless tobacco. She reports that she does not drink alcohol and does not use drugs.  Outpatient Medications Prior to Visit  Medication Sig Dispense Refill  . drospirenone-ethinyl estradiol (YAZ) 3-0.02 MG tablet Take 1 tablet by mouth daily.    Marland Kitchen escitalopram (LEXAPRO) 20 MG tablet Take 1 tablet (20 mg total) by mouth daily for 7 days. 30 tablet 2  . metFORMIN (GLUCOPHAGE) 500 MG tablet Take 500 mg by mouth 2 (two) times daily with a meal.     No facility-administered medications prior to visit.    ROS Review of Systems  Constitutional: Negative.  Negative for appetite change, chills, diaphoresis, fatigue, fever and unexpected weight change.  HENT: Negative.   Eyes: Negative.   Respiratory: Negative for cough, chest tightness, shortness of breath and wheezing.   Cardiovascular: Negative for chest pain, palpitations  and leg swelling.  Gastrointestinal: Positive for abdominal pain, blood in stool and diarrhea. Negative for constipation, nausea and vomiting.  Endocrine: Negative.   Genitourinary: Negative.  Negative for difficulty urinating.  Musculoskeletal: Negative.  Negative for arthralgias, back pain, myalgias and neck pain.  Skin: Negative.  Negative for color change, pallor and rash.  Neurological: Negative.   Hematological: Negative.  Negative for adenopathy. Does not bruise/bleed easily.  Psychiatric/Behavioral: Negative.     Objective:  BP 124/78   Pulse 97   Temp 97.6 F (36.4 C) (Oral)   Resp 18   Ht 5\' 2"  (1.575 m)   Wt 242 lb 9.6 oz (110 kg)   LMP 07/03/2020   SpO2 97%   BMI 44.37 kg/m   Physical Exam Vitals reviewed.  HENT:     Nose: Nose normal.     Mouth/Throat:     Mouth: Mucous membranes are moist.  Eyes:     General: No scleral icterus.    Conjunctiva/sclera: Conjunctivae normal.  Cardiovascular:     Rate and Rhythm: Normal rate and regular rhythm.     Pulses: Normal pulses.     Heart sounds: No murmur heard. No gallop.   Pulmonary:     Effort: Pulmonary effort is normal.     Breath sounds: No stridor. No wheezing, rhonchi or rales.  Abdominal:     General: Abdomen is protuberant. Bowel sounds are normal. There is no distension.  Palpations: Abdomen is soft. There is no hepatomegaly, splenomegaly or mass.     Tenderness: There is no abdominal tenderness.  Musculoskeletal:        General: Normal range of motion.     Cervical back: Neck supple.     Right lower leg: No edema.     Left lower leg: No edema.  Lymphadenopathy:     Cervical: No cervical adenopathy.  Skin:    General: Skin is warm and dry.     Coloration: Skin is not pale.     Findings: No rash.  Neurological:     General: No focal deficit present.     Mental Status: She is alert and oriented to person, place, and time. Mental status is at baseline.  Psychiatric:        Mood and Affect:  Mood normal.        Behavior: Behavior normal.        Thought Content: Thought content normal.        Judgment: Judgment normal.     Lab Results  Component Value Date   WBC 8.8 07/11/2020   HGB 13.5 07/11/2020   HCT 40.2 07/11/2020   PLT 385.0 07/11/2020   GLUCOSE 85 07/11/2020   CHOL 192 07/11/2020   TRIG 142.0 07/11/2020   HDL 59.60 07/11/2020   LDLCALC 104 (H) 07/11/2020   ALT 24 07/11/2020   AST 22 07/11/2020   NA 137 07/11/2020   K 3.9 07/11/2020   CL 99 07/11/2020   CREATININE 0.71 07/11/2020   BUN 6 07/11/2020   CO2 27 07/11/2020   TSH 2.85 07/11/2020    Assessment & Plan:   Carol Manning was seen today for new patient (initial visit) and annual exam.  Diagnoses and all orders for this visit:  Diarrhea, unspecified type- Labs are negative for secondary causes however her folate is mildly elevated and her B12 is mildly low.  I am therefore concerned that the diarrhea is SIBO.  I recommended that she take a course of Xifaxan.  Will also screen her for celiac disease. -     CBC with Differential/Platelet; Future -     Basic metabolic panel; Future -     Vitamin B12; Future -     Gliadin antibodies, serum; Future -     Tissue transglutaminase, IgA; Future -     Reticulin Antibody, IgA w reflex titer; Future -     Folate; Future -     TSH; Future -     Hepatic function panel; Future -     diphenoxylate-atropine (LOMOTIL) 2.5-0.025 MG tablet; Take 1 tablet by mouth 4 (four) times daily as needed for diarrhea or loose stools. -     Ambulatory referral to Gastroenterology -     Hepatic function panel -     TSH -     Folate -     Reticulin Antibody, IgA w reflex titer -     Tissue transglutaminase, IgA -     Gliadin antibodies, serum -     Vitamin B12 -     Basic metabolic panel -     CBC with Differential/Platelet  Encounter for general adult medical examination with abnormal findings- Exam completed, labs reviewed, vaccines reviewed and updated, patient education  material was given. -     Lipid panel; Future -     HIV Antibody (routine testing w rflx); Future -     Hepatitis C antibody; Future -     Hepatitis C  antibody -     HIV Antibody (routine testing w rflx) -     Lipid panel  BRBPR (bright red blood per rectum) -     Ambulatory referral to Gastroenterology  Small intestinal bacterial overgrowth (SIBO) -     rifaximin (XIFAXAN) 550 MG TABS tablet; Take 1 tablet (550 mg total) by mouth 3 (three) times daily for 14 days.   I am having Carol Manning start on diphenoxylate-atropine and rifaximin. I am also having her maintain her drospirenone-ethinyl estradiol, metFORMIN, and escitalopram.  Meds ordered this encounter  Medications  . diphenoxylate-atropine (LOMOTIL) 2.5-0.025 MG tablet    Sig: Take 1 tablet by mouth 4 (four) times daily as needed for diarrhea or loose stools.    Dispense:  65 tablet    Refill:  2  . rifaximin (XIFAXAN) 550 MG TABS tablet    Sig: Take 1 tablet (550 mg total) by mouth 3 (three) times daily for 14 days.    Dispense:  42 tablet    Refill:  0     Follow-up: Return in about 3 months (around 10/08/2020).  Sanda Linger, MD

## 2020-07-12 DIAGNOSIS — K6389 Other specified diseases of intestine: Secondary | ICD-10-CM | POA: Insufficient documentation

## 2020-07-12 LAB — LIPID PANEL
Cholesterol: 192 mg/dL (ref 0–200)
HDL: 59.6 mg/dL (ref >39.00)
LDL Cholesterol: 104 mg/dL — ABNORMAL HIGH (ref 0–99)
NonHDL: 132.55
Total CHOL/HDL Ratio: 3
Triglycerides: 142 mg/dL (ref 0.0–149.0)
VLDL: 28.4 mg/dL (ref 0.0–40.0)

## 2020-07-12 LAB — CBC WITH DIFFERENTIAL/PLATELET
Basophils Absolute: 0.1 10*3/uL (ref 0.0–0.1)
Basophils Relative: 1.3 % (ref 0.0–3.0)
Eosinophils Absolute: 0.1 10*3/uL (ref 0.0–0.7)
Eosinophils Relative: 1.5 % (ref 0.0–5.0)
HCT: 40.2 % (ref 36.0–46.0)
Hemoglobin: 13.5 g/dL (ref 12.0–15.0)
Lymphocytes Relative: 33.8 % (ref 12.0–46.0)
Lymphs Abs: 3 10*3/uL (ref 0.7–4.0)
MCHC: 33.6 g/dL (ref 30.0–36.0)
MCV: 90.3 fl (ref 78.0–100.0)
Monocytes Absolute: 0.6 10*3/uL (ref 0.1–1.0)
Monocytes Relative: 6.8 % (ref 3.0–12.0)
Neutro Abs: 5 10*3/uL (ref 1.4–7.7)
Neutrophils Relative %: 56.6 % (ref 43.0–77.0)
Platelets: 385 10*3/uL (ref 150.0–400.0)
RBC: 4.45 Mil/uL (ref 3.87–5.11)
RDW: 13.8 % (ref 11.5–15.5)
WBC: 8.8 10*3/uL (ref 4.0–10.5)

## 2020-07-12 LAB — BASIC METABOLIC PANEL
BUN: 6 mg/dL (ref 6–23)
CO2: 27 mEq/L (ref 19–32)
Calcium: 9.2 mg/dL (ref 8.4–10.5)
Chloride: 99 mEq/L (ref 96–112)
Creatinine, Ser: 0.71 mg/dL (ref 0.40–1.20)
GFR: 118.09 mL/min (ref >60.00)
Glucose, Bld: 85 mg/dL (ref 70–99)
Potassium: 3.9 mEq/L (ref 3.5–5.1)
Sodium: 137 mEq/L (ref 135–145)

## 2020-07-12 LAB — HEPATIC FUNCTION PANEL
ALT: 24 U/L (ref 0–35)
AST: 22 U/L (ref 0–37)
Albumin: 3.9 g/dL (ref 3.5–5.2)
Alkaline Phosphatase: 69 U/L (ref 39–117)
Bilirubin, Direct: 0 mg/dL (ref 0.0–0.3)
Total Bilirubin: 0.3 mg/dL (ref 0.2–1.2)
Total Protein: 7.7 g/dL (ref 6.0–8.3)

## 2020-07-12 LAB — VITAMIN B12: Vitamin B-12: 269 pg/mL (ref 211–911)

## 2020-07-12 LAB — TSH: TSH: 2.85 u[IU]/mL (ref 0.35–4.50)

## 2020-07-12 LAB — FOLATE: Folate: 15.9 ng/mL (ref >5.9)

## 2020-07-12 MED ORDER — RIFAXIMIN 550 MG PO TABS: 550.0000 mg | ORAL_TABLET | Freq: Three times a day (TID) | ORAL | 0 refills | Status: AC

## 2020-07-16 ENCOUNTER — Encounter: Payer: Self-pay | Admitting: Internal Medicine

## 2020-07-17 ENCOUNTER — Encounter: Payer: Self-pay | Admitting: Internal Medicine

## 2020-07-18 ENCOUNTER — Ambulatory Visit: Payer: Self-pay

## 2020-07-20 LAB — HIV ANTIBODY (ROUTINE TESTING W REFLEX): HIV 1&2 Ab, 4th Generation: NONREACTIVE

## 2020-07-20 LAB — HEPATITIS C ANTIBODY
Hepatitis C Ab: NONREACTIVE
SIGNAL TO CUT-OFF: 0.01 (ref <1.00)

## 2020-07-20 LAB — RETICULIN ANTIBODIES, IGA W TITER: Reticulin IgA Screen: NEGATIVE

## 2020-07-20 LAB — TISSUE TRANSGLUTAMINASE, IGA: (tTG) Ab, IgA: <1 U/mL

## 2020-07-20 LAB — GLIADIN ANTIBODIES, SERUM
Gliadin IgA: 1.1 U/mL
Gliadin IgG: <1 U/mL

## 2020-07-25 ENCOUNTER — Encounter: Payer: Self-pay | Admitting: Physician Assistant

## 2020-07-26 ENCOUNTER — Encounter: Payer: Self-pay | Admitting: Internal Medicine

## 2020-07-27 ENCOUNTER — Ambulatory Visit
Admission: RE | Admit: 2020-07-27 | Discharge: 2020-07-27 | Disposition: A | Discharge status: Home / Self Care | Payer: Managed Care, Other (non HMO) | Source: Ambulatory Visit | Attending: Family Medicine | Admitting: Family Medicine

## 2020-07-27 ENCOUNTER — Other Ambulatory Visit: Payer: Self-pay

## 2020-07-27 VITALS — BP 130/94 | HR 93 | Temp 97.6°F | Resp 18

## 2020-07-27 DIAGNOSIS — R1111 Vomiting without nausea: Secondary | ICD-10-CM

## 2020-07-27 DIAGNOSIS — R1084 Generalized abdominal pain: Secondary | ICD-10-CM | POA: Diagnosis not present

## 2020-07-27 NOTE — ED Triage Notes (Signed)
Pt sts has been on antibiotics for diarrhea and still having diarrhea; pt sts today while having episode of diarrhea she has sharp pain that then caused her to vomit; pt sts pain has resolved now

## 2020-07-27 NOTE — Discharge Instructions (Signed)
Follow up with GI as scheduled and Primary Care for recheck prior to that if needed

## 2020-07-28 LAB — CBC WITH DIFFERENTIAL/PLATELET
Basophils Absolute: 0.1 10*3/uL (ref 0.0–0.2)
Basos: 1 %
EOS (ABSOLUTE): 0.1 10*3/uL (ref 0.0–0.4)
Eos: 1 %
Hematocrit: 42.9 % (ref 34.0–46.6)
Hemoglobin: 14.3 g/dL (ref 11.1–15.9)
Immature Grans (Abs): 0.1 10*3/uL (ref 0.0–0.1)
Immature Granulocytes: 1 %
Lymphocytes Absolute: 2.7 10*3/uL (ref 0.7–3.1)
Lymphs: 29 %
MCH: 30.1 pg (ref 26.6–33.0)
MCHC: 33.3 g/dL (ref 31.5–35.7)
MCV: 90 fL (ref 79–97)
Monocytes Absolute: 0.9 10*3/uL (ref 0.1–0.9)
Monocytes: 10 %
Neutrophils Absolute: 5.4 10*3/uL (ref 1.4–7.0)
Neutrophils: 58 %
Platelets: 406 10*3/uL (ref 150–450)
RBC: 4.75 x10E6/uL (ref 3.77–5.28)
RDW: 13.1 % (ref 11.7–15.4)
WBC: 9.2 10*3/uL (ref 3.4–10.8)

## 2020-07-28 LAB — COMPREHENSIVE METABOLIC PANEL
ALT: 44 IU/L — ABNORMAL HIGH (ref 0–32)
AST: 51 IU/L — ABNORMAL HIGH (ref 0–40)
Albumin/Globulin Ratio: 1.1 — ABNORMAL LOW (ref 1.2–2.2)
Albumin: 4.1 g/dL (ref 3.9–5.0)
Alkaline Phosphatase: 74 IU/L (ref 44–121)
BUN/Creatinine Ratio: 13 (ref 9–23)
BUN: 7 mg/dL (ref 6–20)
Bilirubin Total: <0.2 mg/dL (ref 0.0–1.2)
CO2: 19 mmol/L — ABNORMAL LOW (ref 20–29)
Calcium: 9.6 mg/dL (ref 8.7–10.2)
Chloride: 101 mmol/L (ref 96–106)
Creatinine, Ser: 0.54 mg/dL — ABNORMAL LOW (ref 0.57–1.00)
GFR calc Af Amer: 152 mL/min/{1.73_m2} (ref >59)
GFR calc non Af Amer: 132 mL/min/{1.73_m2} (ref >59)
Globulin, Total: 3.8 g/dL (ref 1.5–4.5)
Glucose: 78 mg/dL (ref 65–99)
Potassium: 4.3 mmol/L (ref 3.5–5.2)
Sodium: 136 mmol/L (ref 134–144)
Total Protein: 7.9 g/dL (ref 6.0–8.5)

## 2020-07-28 LAB — LIPASE: Lipase: 35 U/L (ref 14–72)

## 2020-07-28 NOTE — ED Provider Notes (Signed)
[facility]    CSN: [id number] Arrival date & time: 07/27/20  1346      History   Chief Complaint Chief Complaint  Patient presents with  . Abdominal Pain  . Appointment    1400    HPI Mikka Kissner is a 25 y.o. female.   Patient presenting today with an episode of sharp abdominal/right flank pain that lasted about an hour earlier today and was so severe that it caused her to vomit.  States she has been seen multiple times in the past few weeks for ongoing intractable diarrhea, most recently treated with rifaximin and Lomotil by PCP which did help some.  She is scheduled to see GI in about 2 weeks to follow-up on this issue.  Up until now had no abdominal pain symptoms with this diarrhea mostly just cramping sensation.  Pain resolved on its own after about an hour and now just cramping again.  Denies fevers, chest pain, shortness of breath, hematemesis, melena, dysuria, hematuria, new foods or medications.  Per record review just had extensive labs drawn through PCP about 2 days ago, all fairly benign appearing.     Past Medical History:  Diagnosis Date  . Anxiety   . Depression   . PCOS (polycystic ovarian syndrome)     Patient Active Problem List   Diagnosis Date Noted  . Small intestinal bacterial overgrowth (SIBO) 07/12/2020  . Diarrhea 07/11/2020  . Encounter for general adult medical examination with abnormal findings 07/11/2020  . BRBPR (bright red blood per rectum) 07/11/2020  . PCOS (polycystic ovarian syndrome) 12/14/2013    History reviewed. No pertinent surgical history.  OB History   No obstetric history on file.      Home Medications    Prior to Admission medications   Medication Sig Start Date End Date Taking? Authorizing Provider  diphenoxylate-atropine (LOMOTIL) 2.5-0.025 MG tablet Take 1 tablet by mouth 4 (four) times daily as needed for diarrhea or loose stools. 07/11/20   Etta Grandchild, MD  drospirenone-ethinyl estradiol (YAZ) 3-0.02  MG tablet Take 1 tablet by mouth daily.    [provider]  escitalopram (LEXAPRO) 20 MG tablet Take 1 tablet (20 mg total) by mouth daily for 7 days. 07/10/20 07/17/20  Thresa Ross, MD  metFORMIN (GLUCOPHAGE) 500 MG tablet Take 500 mg by mouth 2 (two) times daily with a meal.    [provider]    Family History Family History  Adopted: Yes  Family history unknown: Yes    Social History Social History   Tobacco Use  . Smoking status: Never Smoker  . Smokeless tobacco: Never Used  Vaping Use  . Vaping Use: Never used  Substance Use Topics  . Alcohol use: Never  . Drug use: Never     Allergies   Patient has no known allergies.   Review of Systems Review of Systems Per HPI Physical Exam Triage Vital Signs ED Triage Vitals  Enc Vitals Group     BP 07/27/20 1400 (!) 130/94     Pulse Rate 07/27/20 1400 93     Resp 07/27/20 1400 18     Temp 07/27/20 1400 97.6 F (36.4 C)     Temp Source 07/27/20 1400 Oral     SpO2 07/27/20 1400 95 %     Weight --      Height --      Head Circumference --      Peak Flow --      Pain Score 07/27/20  1401 0     Pain Loc --      Pain Edu? --      Excl. in GC? --    No data found.  Updated Vital Signs BP (!) 130/94 (BP Location: Left Arm)   Pulse 93   Temp 97.6 F (36.4 C) (Oral)   Resp 18   LMP 07/03/2020   SpO2 95%   Visual Acuity Right Eye Distance:   Left Eye Distance:   Bilateral Distance:    Right Eye Near:   Left Eye Near:    Bilateral Near:     Physical Exam Vitals and nursing note reviewed.  Constitutional:      Appearance: Normal appearance. She is not ill-appearing.  HENT:     Head: Atraumatic.     Mouth/Throat:     Mouth: Mucous membranes are moist.     Pharynx: Oropharynx is clear.  Eyes:     Extraocular Movements: Extraocular movements intact.     Conjunctiva/sclera: Conjunctivae normal.  Cardiovascular:     Rate and Rhythm: Normal rate and regular rhythm.     Heart sounds:  Normal heart sounds.  Pulmonary:     Effort: Pulmonary effort is normal.     Breath sounds: Normal breath sounds.  Abdominal:     General: Bowel sounds are normal. There is no distension.     Palpations: Abdomen is soft.     Tenderness: There is no abdominal tenderness. There is no right CVA tenderness, left CVA tenderness or guarding.  Musculoskeletal:        General: Normal range of motion.     Cervical back: Normal range of motion and neck supple.  Skin:    General: Skin is warm and dry.  Neurological:     Mental Status: She is alert and oriented to person, place, and time.  Psychiatric:        Mood and Affect: Mood normal.        Thought Content: Thought content normal.        Judgment: Judgment normal.      UC Treatments / Results  Labs (all labs ordered are listed, but only abnormal results are displayed) Labs Reviewed  COMPREHENSIVE METABOLIC PANEL - Abnormal; Notable for the following components:      Result Value   Creatinine, Ser 0.54 (*)    CO2 19 (*)    Albumin/Globulin Ratio 1.1 (*)    AST 51 (*)    ALT 44 (*)    All other components within normal limits   Narrative:    Performed at:  [address], [place], [place]  [id number] Lab Director: Jolene Schimke MD, Phone:  [phone]  CBC WITH DIFFERENTIAL/PLATELET   Narrative:    Performed at:  [address] [place] [address] [place], [place]  [id number] Lab Director: Jolene Schimke MD, Phone:  [phone]  LIPASE   Narrative:    Performed at:  [address] [place] [address], [place], [place]  [id number] Lab Director: Jolene Schimke MD, Phone:  [phone]    EKG   Radiology No results found.  Procedures Procedures (including critical care time)  Medications Ordered in UC Medications - No data to display  Initial Impression / Assessment and Plan / UC Course  I have reviewed the triage vital signs and the nursing notes.  Pertinent labs & imaging results that  were available during my care of the patient were reviewed by me and considered in my medical decision  making (see chart for details).     Exam and vitals reassuring today, will repeat some basic labs particularly CBC given new onset of pain symptoms.  Discussed over the counter pain relievers, fluids, bland diet, close PCP follow-up first thing next week for recheck.  Follow-up in the ED if symptoms worsen at any point.  Final Clinical Impressions(s) / UC Diagnoses   Final diagnoses:  Generalized abdominal pain     Discharge Instructions     Follow up with GI as scheduled and Primary Care for recheck prior to that if needed    ED Prescriptions    None     PDMP not reviewed this encounter.   Particia Nearing, [place] 07/28/20 1620

## 2020-08-09 ENCOUNTER — Other Ambulatory Visit: Payer: Managed Care, Other (non HMO)

## 2020-08-09 ENCOUNTER — Ambulatory Visit (INDEPENDENT_AMBULATORY_CARE_PROVIDER_SITE_OTHER): Payer: Managed Care, Other (non HMO) | Admitting: Physician Assistant

## 2020-08-09 ENCOUNTER — Encounter: Payer: Self-pay | Admitting: Physician Assistant

## 2020-08-09 VITALS — BP 130/66 | HR 118 | Ht 62.0 in | Wt 244.0 lb

## 2020-08-09 DIAGNOSIS — R1084 Generalized abdominal pain: Secondary | ICD-10-CM | POA: Diagnosis not present

## 2020-08-09 DIAGNOSIS — R112 Nausea with vomiting, unspecified: Secondary | ICD-10-CM | POA: Diagnosis not present

## 2020-08-09 DIAGNOSIS — R197 Diarrhea, unspecified: Secondary | ICD-10-CM

## 2020-08-09 DIAGNOSIS — K625 Hemorrhage of anus and rectum: Secondary | ICD-10-CM

## 2020-08-09 MED ORDER — ONDANSETRON HCL 4 MG PO TABS: 4.0000 mg | ORAL_TABLET | Freq: Three times a day (TID) | ORAL | 2 refills | Status: AC | PRN

## 2020-08-09 MED ORDER — SUTAB 1479-225-188 MG PO TABS: 1.0000 | ORAL_TABLET | Freq: Once | ORAL | 0 refills | Status: AC

## 2020-08-09 MED ORDER — DIPHENOXYLATE-ATROPINE 2.5-0.025 MG PO TABS: 1.0000 | ORAL_TABLET | Freq: Four times a day (QID) | ORAL | 2 refills | Status: AC | PRN

## 2020-08-09 NOTE — Patient Instructions (Signed)
If you are age 26 or older, your body mass index should be between 23-30. Your Body mass index is 44.63 kg/m. If this is out of the aforementioned range listed, please consider follow up with your Primary Care Provider.  If you are age 69 or younger, your body mass index should be between 19-25. Your Body mass index is 44.63 kg/m. If this is out of the aformentioned range listed, please consider follow up with your Primary Care Provider.   You have been scheduled for an endoscopy and colonoscopy. Please follow the written instructions given to you at your visit today. Please pick up your prep supplies at the pharmacy within the next 1-3 days. If you use inhalers (even only as needed), please bring them with you on the day of your procedure.  Your provider has requested that you go to the basement level for lab work before leaving today. Press "B" on the elevator. The lab is located at the first door on the left as you exit the elevator.  We have sent the following medications to your pharmacy for you to pick up at your convenience: Zofran and Lomotil.  Thank you for choosing me and [facility].  Hyacinth Meeker, PA-C

## 2020-08-09 NOTE — Progress Notes (Signed)
Chief Complaint: Diarrhea, rectal bleeding  HPI:    Carol Manning is a 25 year old female with a past medical history of anxiety and depression, who was referred to me by Etta Grandchild, MD for a complaint of diarrhea and rectal bleeding.      07/11/2020 patient seen by PCP for diarrhea.  At that time describes 6-8 watery bowel movements a day and minimal relief with Imodium.  Describes some burning/crampy sensation in the pit of her stomach and stool urgency.  Also mild nausea.  She was given a course of Xifaxan and screened for celiac disease which was negative.  Also given Lomotil.    07/27/2020 patient seen in urgent care for abdominal pain.  That time described an episode of sharp abdominal/right flank pain that lasted about an hour and was so severe that it caused her to vomit.  Had been seen multiple times in the past few weeks for ongoing intractable diarrhea and was treated with rifaximin Lomotil by her PCP which helps some.  CMP with a creatinine of 0.54, AST 51, ALT 44, CBC and lipase normal.    Today, the patient tells me that she took antibiotics for the diarrhea but it never got better.  She will have 6-8 watery urgent stools preceded by some abdominal cramping daily if she does not use the Lomotil, she uses Lomotil and typically this is down to just once a day.  Tells me along with this she has been seeing a lot of mucus in her stools and has even seen some bright red blood on 3 separate occasions, the last being the beginning of March but denies any rectal pain or discomfort.    Also describes chronic nausea which seems to come on when she is anxious but has also been coming on at other times recently.  Also describes vomiting occasionally, most recently was seen in the ER for this after an episode of abdominal pain in her upper abdomen caused her severe pain and she vomited.  Tells me she has not vomited since then.    Also complains of severe fatigue telling me that she will sleep 16 to 18  hours a day if she is not working.    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  Does admit to being anxious and has been having some dreams about her health which make her think that something is going on.    Denies fever, chills, weight loss or symptoms that awaken her from sleep.  Past Medical History:  Diagnosis Date  . Anxiety   . Depression   . PCOS (polycystic ovarian syndrome)     History reviewed. No pertinent surgical history.  Current Outpatient Medications  Medication Sig Dispense Refill  . diphenoxylate-atropine (LOMOTIL) 2.5-0.025 MG tablet Take 1 tablet by mouth 4 (four) times daily as needed for diarrhea or loose stools. 65 tablet 2  . drospirenone-ethinyl estradiol (YAZ) 3-0.02 MG tablet Take 1 tablet by mouth daily.    . metFORMIN (GLUCOPHAGE) 500 MG tablet Take 500 mg by mouth 2 (two) times daily with a meal.    . escitalopram (LEXAPRO) 20 MG tablet Take 1 tablet (20 mg total) by mouth daily for 7 days. 30 tablet 2   No current facility-administered medications for this visit.    Allergies as of 08/09/2020  . (No Known Allergies)    Family History  Adopted: Yes  Family history unknown: Yes    Social History  Socioeconomic History  . Marital status: Single    Spouse name: Not on file  . Number of children: Not on file  . Years of education: Not on file  . Highest education level: Not on file  Occupational History  . Not on file  Tobacco Use  . Smoking status: Never Smoker  . Smokeless tobacco: Never Used  Vaping Use  . Vaping Use: Never used  Substance and Sexual Activity  . Alcohol use: Never  . Drug use: Never  . Sexual activity: Never  Other Topics Concern  . Not on file  Social History Narrative  . Not on file   Social Determinants of Health   Financial Resource Strain: Not on file  Food Insecurity: Not on file  Transportation Needs: Not on file  Physical Activity: Not on file  Stress:  Not on file  Social Connections: Not on file  Intimate Partner Violence: Not on file    Review of Systems:    Constitutional: No weight loss, fever or chills Skin: No rash  Cardiovascular: No chest pain Respiratory: No SOB Gastrointestinal: See HPI and otherwise negative Genitourinary: No dysuria  Neurological: No headache, dizziness or syncope Musculoskeletal: No new muscle or joint pain Hematologic: No bleeding Psychiatric: No history of depression or anxiety   Physical Exam:  Vital signs: BP 130/66   Pulse (!) 118   Ht 5\' 2"  (1.575 m)   Wt 244 lb (110.7 kg)   SpO2 98%   BMI 44.63 kg/m   Constitutional:   Pleasant overweight Asian female appears to be in NAD, Well developed, Well nourished, alert and cooperative Head:  Normocephalic and atraumatic. Eyes:   PEERL, EOMI. No icterus. Conjunctiva pink. Ears:  Normal auditory acuity. Neck:  Supple Throat: Oral cavity and pharynx without inflammation, swelling or lesion.  Respiratory: Respirations even and unlabored. Lungs clear to auscultation bilaterally.   No wheezes, crackles, or rhonchi.  Cardiovascular: Normal S1, S2. No MRG. Regular rate and rhythm. No peripheral edema, cyanosis or pallor.  Gastrointestinal:  Soft, nondistended, mild generalized TTP. No rebound or guarding. Normal bowel sounds. No appreciable masses or hepatomegaly. Rectal:  Not performed.  Msk:  Symmetrical without gross deformities. Without edema, no deformity or joint abnormality.  Neurologic:  Alert and  oriented x4;  grossly normal neurologically.  Skin:   Dry and intact without significant lesions or rashes. Psychiatric: Demonstrates good judgement and reason without abnormal affect or behaviors.  RELEVANT LABS AND IMAGING: CBC    Component Value Date/Time   WBC 9.2 07/27/2020 1434   WBC 8.8 07/11/2020 1640   RBC 4.75 07/27/2020 1434   RBC 4.45 07/11/2020 1640   HGB 14.3 07/27/2020 1434   HCT 42.9 07/27/2020 1434   PLT 406 07/27/2020 1434    MCV 90 07/27/2020 1434   MCH 30.1 07/27/2020 1434   MCH 30.2 04/05/2020 0050   MCHC 33.3 07/27/2020 1434   MCHC 33.6 07/11/2020 1640   RDW 13.1 07/27/2020 1434   LYMPHSABS 2.7 07/27/2020 1434   MONOABS 0.6 07/11/2020 1640   EOSABS 0.1 07/27/2020 1434   BASOSABS 0.1 07/27/2020 1434    CMP     Component Value Date/Time   NA 136 07/27/2020 1434   K 4.3 07/27/2020 1434   CL 101 07/27/2020 1434   CO2 19 (L) 07/27/2020 1434   GLUCOSE 78 07/27/2020 1434   GLUCOSE 85 07/11/2020 1640   BUN 7 07/27/2020 1434   CREATININE 0.54 (L) 07/27/2020 1434   CALCIUM 9.6 07/27/2020 1434  PROT 7.9 07/27/2020 1434   ALBUMIN 4.1 07/27/2020 1434   AST 51 (H) 07/27/2020 1434   ALT 44 (H) 07/27/2020 1434   ALKPHOS 74 07/27/2020 1434   BILITOT <0.2 07/27/2020 1434   GFRNONAA 132 07/27/2020 1434   GFRNONAA >60 04/05/2020 0050   GFRAA 152 07/27/2020 1434    Assessment: 1.  Generalized abdominal pain: With all below 2.  Nausea and vomiting: Patient complains of chronic nausea related to anxiety, but has had an episode of vomiting recently related to abdominal pain; consider gastritis+/-functional dyspepsia 3.  Bright red blood per rectum: With below, likely hemorrhoids but patient declined rectal exam today 4.  Diarrhea: For the past 3 months, Lomotil is the only thing which slows it down, no prior stool studies, celiac testing and other blood work normal; consider relation to Metformin versus high suspicion for IBS  Plan: 1.  Discussed with the patient I feel like she would benefit from an EGD and colonoscopy for further eval.  Scheduled with Dr. Myrtie Neither in the [facility].  This will be at least a month out from now to allow [place] time to get stool studies back.  Patient was provided with a detailed list of risks for the procedure and she agrees to proceed. 2.  GI pathogen panel ordered 3.  Refilled Lomotil 4.  Prescribed Zofran 4 mg every 4-6 hours as needed for nausea #30 with 1 refill. 5.  Provided the  patient with a work note for another week given her ongoing fatigue and diarrhea.  Tells me that if she has a note she can work from home which makes it easier on her given current symptoms. 6.  Patient to follow in clinic per recommendations after procedures with Dr. Myrtie Neither.  Carol Meeker, PA-C [facility] 08/09/2020, 10:22 AM  Cc: Etta Grandchild, MD

## 2020-08-10 NOTE — Progress Notes (Signed)
____________________________________________________________  Attending physician addendum:  Thank you for sending this case to me. I have reviewed the entire note and agree with the plan.  However, I would prefer to get her colonoscopy done sooner with the severity of symptoms.  That may mean doing it without the EGD for schedule availability, but I feel the colonoscopy is more important.  I will work with my nurse to look for possible [facility] schedule adjustments.  Amada Jupiter, MD  ____________________________________________________________

## 2020-08-12 LAB — GI PROFILE, STOOL, PCR

## 2020-08-13 ENCOUNTER — Telehealth: Payer: Self-pay | Admitting: Physician Assistant

## 2020-08-13 NOTE — Telephone Encounter (Signed)
Patient is returning your call.  

## 2020-08-13 NOTE — Telephone Encounter (Signed)
Spoke with patient, see 08/09/20 stool study result note for more information.

## 2020-08-14 ENCOUNTER — Telehealth: Payer: Self-pay

## 2020-08-14 NOTE — Telephone Encounter (Signed)
Lm on vm for patient to return call to offer her a sooner colonoscopy appt on 08/22/20 at 11:30 AM, arrival 10:30 AM. Will further discuss with patient.

## 2020-08-14 NOTE — Telephone Encounter (Signed)
Spoke with patient, her colonoscopy has been moved to Wednesday, 08/22/20 at 11:30 AM and will arrive at 10:30 AM with a care partner. EGD is still scheduled for 10/02/20 at 2 PM, with a 1 PM arrival as there are no sooner appts. Will send updated instructions to patient via My Chart. Patient verbalized understanding and had no concerns at the end of the call.

## 2020-08-21 ENCOUNTER — Telehealth: Payer: Self-pay

## 2020-08-21 NOTE — Telephone Encounter (Signed)
Lm on vm for patient to return call regarding scheduled procedure tomorrow. Dr. Myrtie Neither had another cancellation and wanted to offer both procedures tomorrow if that will work for the patient. Will await return call to confirm.

## 2020-08-21 NOTE — Telephone Encounter (Signed)
Spoke with patient, she is able to come in for double procedure tomorrow. She will arrive at 10:30 am and will follow colonoscopy instructions as directed. [facility] admitting is aware.

## 2020-08-22 ENCOUNTER — Other Ambulatory Visit: Payer: Self-pay

## 2020-08-22 ENCOUNTER — Ambulatory Visit (AMBULATORY_SURGERY_CENTER): Payer: Managed Care, Other (non HMO) | Admitting: Gastroenterology

## 2020-08-22 ENCOUNTER — Encounter: Payer: Self-pay | Admitting: Gastroenterology

## 2020-08-22 VITALS — BP 115/81 | HR 87 | Temp 98.2°F | Resp 18 | Ht 62.0 in | Wt 244.0 lb

## 2020-08-22 DIAGNOSIS — K2970 Gastritis, unspecified, without bleeding: Secondary | ICD-10-CM

## 2020-08-22 DIAGNOSIS — K3189 Other diseases of stomach and duodenum: Secondary | ICD-10-CM

## 2020-08-22 DIAGNOSIS — K31A11 Gastric intestinal metaplasia without dysplasia, involving the antrum: Secondary | ICD-10-CM | POA: Diagnosis not present

## 2020-08-22 DIAGNOSIS — R197 Diarrhea, unspecified: Secondary | ICD-10-CM | POA: Diagnosis not present

## 2020-08-22 DIAGNOSIS — R112 Nausea with vomiting, unspecified: Secondary | ICD-10-CM

## 2020-08-22 DIAGNOSIS — K625 Hemorrhage of anus and rectum: Secondary | ICD-10-CM

## 2020-08-22 DIAGNOSIS — R1084 Generalized abdominal pain: Secondary | ICD-10-CM

## 2020-08-22 MED ORDER — SODIUM CHLORIDE 0.9 % IV SOLN: 500.0000 mL | Freq: Once | INTRAVENOUS | Status: DC

## 2020-08-22 NOTE — Progress Notes (Signed)
Called to room to assist during endoscopic procedure.  Patient ID and intended procedure confirmed with present staff. Received instructions for my participation in the procedure from the performing physician.  

## 2020-08-22 NOTE — Op Note (Signed)
[facility] Patient Name: Carol Manning Procedure Date: 08/22/2020 12:29 PM MRN: [id number] Endoscopist: Sherilyn Cooter L. Myrtie Neither , MD Age: 25 Referring MD:  Date of Birth: 12-Mar-1996 Gender: Female Account #: [id number] Procedure:                Colonoscopy Indications:              Generalized abdominal pain, Chronic diarrhea Medicines:                Monitored Anesthesia Care Procedure:                Pre-Anesthesia Assessment:                           - Prior to the procedure, a History and Physical                            was performed, and patient medications and                            allergies were reviewed. The patient's tolerance of                            previous anesthesia was also reviewed. The risks                            and benefits of the procedure and the sedation                            options and risks were discussed with the patient.                            All questions were answered, and informed consent                            was obtained. Prior Anticoagulants: The patient has                            taken no previous anticoagulant or antiplatelet                            agents. ASA Grade Assessment: III - A patient with                            severe systemic disease. After reviewing the risks                            and benefits, the patient was deemed in                            satisfactory condition to undergo the procedure.                           After obtaining informed consent, the colonoscope  was passed under direct vision. Throughout the                            procedure, the patient's blood pressure, pulse, and                            oxygen saturations were monitored continuously. The                            Olympus CF-HQ190L (Serial# [id number]) Colonoscope was                            introduced through the anus and advanced to the the                            terminal  ileum, with identification of the                            appendiceal orifice and IC valve. The colonoscopy                            was performed without difficulty. The patient                            tolerated the procedure well. The quality of the                            bowel preparation was poor. The terminal ileum,                            ileocecal valve, appendiceal orifice, and rectum                            were photographed. The bowel preparation used was                            Sutab. Scope In: 12:52:27 PM Scope Out: 1:02:15 PM Scope Withdrawal Time: 0 hours 7 minutes 46 seconds  Total Procedure Duration: 0 hours 9 minutes 48 seconds  Findings:                 The perianal and digital rectal examinations were                            normal.                           The terminal ileum appeared normal.                           Normal mucosa was found in the entire colon.                            Biopsies for histology were taken with a cold  forceps from the ascending colon, transverse colon                            and descending colon for evaluation of microscopic                            colitis.                           The exam was otherwise without abnormality on                            direct and retroflexion views. Complications:            No immediate complications. Estimated Blood Loss:     Estimated blood loss was minimal. Impression:               - Preparation of the colon was poor.                           - The examined portion of the ileum was normal.                           - Normal mucosa in the entire examined colon.                            Biopsied.                           - The examination was otherwise normal on direct                            and retroflexion views. Recommendation:           - Patient has a contact number available for                            emergencies. The  signs and symptoms of potential                            delayed complications were discussed with the                            patient. Return to normal activities tomorrow.                            Written discharge instructions were provided to the                            patient.                           - Resume previous diet.                           - Continue present medications.                           -  Await pathology results.                           - No recommendation at this time regarding repeat                            colonoscopy due to young age.                           - Return to my office after studies are complete. Ivaan Liddy L. Myrtie Neitheranis, MD 08/22/2020 1:09:12 PM This report has been signed electronically.

## 2020-08-22 NOTE — Patient Instructions (Signed)
Your upper and lower endoscopies were normal, however biopsies were taken to rule out certain conditions.  Await biopsy results.  YOU HAD AN ENDOSCOPIC PROCEDURE TODAY AT THE [facility]:   Refer to the procedure report that was given to you for any specific questions about what was found during the examination.  If the procedure report does not answer your questions, please call your gastroenterologist to clarify.  If you requested that your care partner not be given the details of your procedure findings, then the procedure report has been included in a sealed envelope for you to review at your convenience later.  YOU SHOULD EXPECT: Some feelings of bloating in the abdomen. Passage of more gas than usual.  Walking can help get rid of the air that was put into your GI tract during the procedure and reduce the bloating. If you had a lower endoscopy (such as a colonoscopy or flexible sigmoidoscopy) you may notice spotting of blood in your stool or on the toilet paper. If you underwent a bowel prep for your procedure, you may not have a normal bowel movement for a few days.  Please Note:  You might notice some irritation and congestion in your nose or some drainage.  This is from the oxygen used during your procedure.  There is no need for concern and it should clear up in a day or so.  SYMPTOMS TO REPORT IMMEDIATELY:   Following lower endoscopy (colonoscopy or flexible sigmoidoscopy):  Excessive amounts of blood in the stool  Significant tenderness or worsening of abdominal pains  Swelling of the abdomen that is new, acute  Fever of 100F or higher   Following upper endoscopy (EGD)  Vomiting of blood or coffee ground material  New chest pain or pain under the shoulder blades  Painful or persistently difficult swallowing  New shortness of breath  Fever of 100F or higher  Black, tarry-looking stools  For urgent or emergent issues, a gastroenterologist can be reached at any hour  by calling [phone]. Do not use MyChart messaging for urgent concerns.    DIET:  We do recommend a small meal at first, but then you may proceed to your regular diet.  Drink plenty of fluids but you should avoid alcoholic beverages for 24 hours.  ACTIVITY:  You should plan to take it easy for the rest of today and you should NOT DRIVE or use heavy machinery until tomorrow (because of the sedation medicines used during the test).    FOLLOW UP: Our staff will call the number listed on your records 48-72 hours following your procedure to check on you and address any questions or concerns that you may have regarding the information given to you following your procedure. If we do not reach you, we will leave a message.  We will attempt to reach you two times.  During this call, we will ask if you have developed any symptoms of COVID 19. If you develop any symptoms (ie: fever, flu-like symptoms, shortness of breath, cough etc.) before then, please call [phone].  If you test positive for Covid 19 in the 2 weeks post procedure, please call and report this information to [place].    If any biopsies were taken you will be contacted by phone or by letter within the next 1-3 weeks.  Please call us at [phone] if you have not heard about the biopsies in 3 weeks.    SIGNATURES/CONFIDENTIALITY: You and/or your care partner have signed paperwork which  will be entered into your electronic medical record.  These signatures attest to the fact that that the information above on your After Visit Summary has been reviewed and is understood.  Full responsibility of the confidentiality of this discharge information lies with you and/or your care-partner.

## 2020-08-22 NOTE — Op Note (Signed)
[facility] Patient Name: Carol Manning Procedure Date: 08/22/2020 12:28 PM MRN: [id number] Endoscopist: Sherilyn Cooter L. Myrtie Neither , MD Age: 25 Referring MD:  Date of Birth: 09-03-1995 Gender: Female Account #: [id number] Procedure:                Upper GI endoscopy Indications:              Upper abdominal pain, Diarrhea, Nausea with vomiting Medicines:                Monitored Anesthesia Care Procedure:                Pre-Anesthesia Assessment:                           - Prior to the procedure, a History and Physical                            was performed, and patient medications and                            allergies were reviewed. The patient's tolerance of                            previous anesthesia was also reviewed. The risks                            and benefits of the procedure and the sedation                            options and risks were discussed with the patient.                            All questions were answered, and informed consent                            was obtained. Prior Anticoagulants: The patient has                            taken no previous anticoagulant or antiplatelet                            agents. ASA Grade Assessment: III - A patient with                            severe systemic disease. After reviewing the risks                            and benefits, the patient was deemed in                            satisfactory condition to undergo the procedure.                           After obtaining informed consent, the endoscope was  passed under direct vision. Throughout the                            procedure, the patient's blood pressure, pulse, and                            oxygen saturations were monitored continuously. The                            Endoscope was introduced through the mouth, and                            advanced to the second part of duodenum. The upper                            GI  endoscopy was accomplished without difficulty.                            The patient tolerated the procedure fairly well. Scope In: Scope Out: Findings:                 The esophagus was normal.                           The entire examined stomach was normal. Biopsies                            were taken with a cold forceps for histology                            (antrum and body, same jar, to r/o H pylori).                           The cardia and gastric fundus were normal on                            retroflexion.                           The examined duodenum was normal. Six biopsies for                            histology were taken from the second portion with a                            cold forceps for evaluation of celiac disease. Complications:            No immediate complications. Estimated Blood Loss:     Estimated blood loss was minimal. Impression:               - Normal esophagus.                           - Normal stomach. Biopsied.                           -  Normal examined duodenum. Biopsied. Recommendation:           - Patient has a contact number available for                            emergencies. The signs and symptoms of potential                            delayed complications were discussed with the                            patient. Return to normal activities tomorrow.                            Written discharge instructions were provided to the                            patient.                           - Resume previous diet.                           - Continue present medications.                           - Await pathology results.                           - See the other procedure note for documentation of                            additional recommendations. Jerrine Urschel L. Myrtie Neither, MD 08/22/2020 1:06:16 PM This report has been signed electronically.

## 2020-08-22 NOTE — Progress Notes (Signed)
Report given to PACU, vss 

## 2020-08-22 NOTE — Progress Notes (Signed)
1235 Robinul 0.1 mg IV given due large amount of secretions upon assessment.  MD made aware, vss  

## 2020-08-22 NOTE — Progress Notes (Signed)
VS taken by S.B. 

## 2020-08-24 ENCOUNTER — Telehealth: Payer: Self-pay

## 2020-08-24 NOTE — Telephone Encounter (Signed)
  Follow up Call-  Call back number 08/22/2020  Post procedure Call Back phone  # [phone]  Permission to leave phone message Yes     Patient questions:  Do you have a fever, pain , or abdominal swelling? No. Pain Score  0 *  Have you tolerated food without any problems? Yes.    Have you been able to return to your normal activities? Yes.    Do you have any questions about your discharge instructions: Diet   No. Medications  No. Follow up visit  No.  Do you have questions or concerns about your Care? No.  Actions: * If pain score is 4 or above: No action needed, pain <4.

## 2020-08-24 NOTE — Telephone Encounter (Signed)
Attempted to reach pt. With follow-up call following endoscopic procedure 08/22/2020.  LM on pt. Voice mail.  Will try to reach pt. Again later today. 

## 2020-08-24 NOTE — Telephone Encounter (Signed)
Per 08/22/20 procedure note - Return to office after studies are complete  Lm on vm for patient to return call to schedule follow up appt.

## 2020-08-27 NOTE — Telephone Encounter (Signed)
Pt was supposed to return once results are back from procedure.  Pt scheduled to see Dr. Myrtie Neither 09/28/20 at 8:40am, please let pt know about the appt.

## 2020-08-27 NOTE — Telephone Encounter (Signed)
Pt is requesting a call back from a nurse to schedule her follow up appt, pt would like to know when she needs to come back

## 2020-08-28 ENCOUNTER — Ambulatory Visit: Payer: Managed Care, Other (non HMO) | Admitting: Physician Assistant

## 2020-08-29 NOTE — Telephone Encounter (Signed)
Informed pt of appt date and time

## 2020-09-24 ENCOUNTER — Encounter: Payer: Managed Care, Other (non HMO) | Admitting: Nurse Practitioner

## 2020-09-28 ENCOUNTER — Encounter: Payer: Self-pay | Admitting: Gastroenterology

## 2020-09-28 ENCOUNTER — Ambulatory Visit (INDEPENDENT_AMBULATORY_CARE_PROVIDER_SITE_OTHER): Payer: Managed Care, Other (non HMO) | Admitting: Gastroenterology

## 2020-09-28 VITALS — BP 112/68 | HR 107 | Ht 62.0 in | Wt 247.0 lb

## 2020-09-28 DIAGNOSIS — K529 Noninfective gastroenteritis and colitis, unspecified: Secondary | ICD-10-CM | POA: Diagnosis not present

## 2020-09-28 DIAGNOSIS — A498 Other bacterial infections of unspecified site: Secondary | ICD-10-CM | POA: Diagnosis not present

## 2020-09-28 DIAGNOSIS — R1084 Generalized abdominal pain: Secondary | ICD-10-CM | POA: Diagnosis not present

## 2020-09-28 DIAGNOSIS — K31A19 Gastric intestinal metaplasia without dysplasia, unspecified site: Secondary | ICD-10-CM | POA: Diagnosis not present

## 2020-09-28 MED ORDER — HYOSCYAMINE SULFATE 0.125 MG SL SUBL: 0.1250 mg | SUBLINGUAL_TABLET | Freq: Four times a day (QID) | SUBLINGUAL | 0 refills | Status: DC | PRN

## 2020-09-28 MED ORDER — AZITHROMYCIN 500 MG PO TABS: 500.0000 mg | ORAL_TABLET | Freq: Every day | ORAL | 0 refills | Status: AC

## 2020-09-28 NOTE — Progress Notes (Signed)
[facility] Progress Note  Chief Complaint: Chronic diarrhea and nausea  Subjective  History: Carol Manning was seen in clinic in early March for chronic diarrhea with intermittent nausea and vomiting and generalized abdominal pain, symptoms going on for at least a month prior to that.  She had had no improvement with a trial of rifaximin given by primary care, and Lomotil had also been given.  GI pathogen panel positive for enteroaggregate of E. coli.  Though this is not always pathologic finding on stool study, patient was offered treatment with azithromycin 500 mg daily x3 days, however she decided to hold off on that.  Colonoscopy to the TI and EGD performed March 23, grossly normal studies.  Carol Manning feels about the same as before.  She still has crampy mid abdominal pain and several loose stools per day.  Stopping metformin did not improve the diarrhea.  She is taking Lomotil with some improvement and denies rectal bleeding presently.  She is also moving to a new job in [place] in the next few months and she might move to that area as well.   ROS: Cardiovascular:  no chest pain Respiratory: no dyspnea Remainder of systems negative except as above Mood stable  The patient's Past Medical, Family and Social History were reviewed and are on file in the EMR. Past Surgical History:  Procedure Laterality Date  . HYMENECTOMY    . WISDOM TOOTH EXTRACTION Bilateral     Objective:  Med list reviewed  Current Outpatient Medications:  .  azithromycin (ZITHROMAX) 500 MG tablet, Take 1 tablet (500 mg total) by mouth daily for 3 days., Disp: 3 tablet, Rfl: 0 .  diphenoxylate-atropine (LOMOTIL) 2.5-0.025 MG tablet, Take 1 tablet by mouth 4 (four) times daily as needed for diarrhea or loose stools., Disp: 65 tablet, Rfl: 2 .  drospirenone-ethinyl estradiol (YAZ) 3-0.02 MG tablet, Take 1 tablet by mouth daily., Disp: , Rfl:  .  hyoscyamine (LEVSIN SL) 0.125 MG SL tablet, Place 1 tablet (0.125  mg total) under the tongue every 6 (six) hours as needed., Disp: 45 tablet, Rfl: 0 .  ondansetron (ZOFRAN) 4 MG tablet, Take 1 tablet (4 mg total) by mouth every 8 (eight) hours as needed for nausea or vomiting., Disp: 30 tablet, Rfl: 2 .  escitalopram (LEXAPRO) 20 MG tablet, Take 1 tablet (20 mg total) by mouth daily for 7 days., Disp: 30 tablet, Rfl: 2   Vital signs in last 24 hrs: Vitals:   09/28/20 0844  BP: 112/68  Pulse: (!) 107  SpO2: 100%   Wt Readings from Last 3 Encounters:  09/28/20 247 lb (112 kg)  08/22/20 244 lb (110.7 kg)  08/09/20 244 lb (110.7 kg)    Physical Exam  Well-appearing  HEENT: sclera anicteric, oral mucosa moist without lesions  Neck: supple, no thyromegaly, JVD or lymphadenopathy  Cardiac: RRR without murmurs, S1S2 heard, no peripheral edema  Pulm: clear to auscultation bilaterally, normal RR and effort noted  Abdomen: soft, no tenderness, with active bowel sounds. No guarding or palpable hepatosplenomegaly.  Skin; warm and dry, no jaundice or rash  Labs:  February lab studies with normal CBC and CMP except AST 51, ALT 44  Negative celiac antibodies  Normal TSH ___________________________________________ Radiologic studies:   ____________________________________________ Other:  1. Surgical [P], duodenum - UNREMARKABLE DUODENAL MUCOSA. - NO FEATURES OF CELIAC SPRUE OR GRANULOMAS. 2. Surgical [P], gastric - GASTRIC ANTRAL AND OXYNTIC MUCOSA WITH SLIGHT CHRONIC INFLAMMATION AND FOCAL INTESTINAL METAPLASIA. - WARTHIN-STARRY NEGATIVE  FOR HELICOBACTER PYLORI. - NO DYSPLASIA OR CARCINOMA. 3. Surgical [P], colon nos, random - UNREMARKABLE COLONIC MUCOSA. - NO MICROSCOPIC COLITIS, ACTIVE INFLAMMATION OR GRANULOMAS. _____________________________________________ Assessment & Plan  Assessment: Encounter Diagnoses  Name Primary?  . Generalized abdominal pain Yes  . Chronic diarrhea   . E coli infection   . 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  The significance of her E. coli infection is unclear, but I recommended she undergo treatment with 3 days of azithromycin 500 mg once daily.  She was agreeable to that.  If symptoms are unchanged after that, then she will let me know and we will do a CT enterography to rule out the low likelihood of more proximal small bowel Crohn's disease.  I also gave her a prescription for hyoscyamine to see if it might help with the symptoms more than Lomotil.  Regarding the focal gastric intestinal metaplasia found incidentally on EGD, there are no clear guidelines on endoscopic follow-up for this, especially at a young age.  However she is adopted and thus does not know any family history and she is of [place] descent.  Therefore, I recommended repeat EGD for endoscopic surveillance in 3 years.  If she moves out of the area, she was encouraged to get a new gastroenterologist and then let us know so we can send records.  Plan: Further plans to follow based on her clinical progress.  30 minutes were spent on this encounter (including chart review, history/exam, counseling/coordination of care, and documentation) > 50% of that time was spent on counseling and coordination of care.  Topics discussed included: See above.  Charlie Pitter III

## 2020-09-28 NOTE — Patient Instructions (Signed)
If you are age 26 or older, your body mass index should be between 23-30. Your Body mass index is 45.18 kg/m. If this is out of the aforementioned range listed, please consider follow up with your Primary Care Provider.  If you are age 21 or younger, your body mass index should be between 19-25. Your Body mass index is 45.18 kg/m. If this is out of the aformentioned range listed, please consider follow up with your Primary Care Provider.   Please give [place] an update in 10-14 days!   It was a pleasure to see you today!  Dr. Myrtie Neither

## 2020-10-02 ENCOUNTER — Encounter: Payer: Managed Care, Other (non HMO) | Admitting: Gastroenterology

## 2020-10-02 DIAGNOSIS — K529 Noninfective gastroenteritis and colitis, unspecified: Secondary | ICD-10-CM

## 2020-10-02 DIAGNOSIS — R1084 Generalized abdominal pain: Secondary | ICD-10-CM

## 2020-10-02 NOTE — Telephone Encounter (Signed)
Please reply to this patient and schedule a CT enterography for this patient , as long as it can be done by May 13th.  Indication:  Generalized abdominal pain, diarrhea, no cause on EGD or colonoscopy.  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

## 2020-10-03 NOTE — Telephone Encounter (Signed)
Patient has been scheduled for a CT enterography at [facility] on Tuesday, 10/09/20 at 10:30 AM. Patient will need to arrive at 9:15 AM. NPO 4 hours prior, can have water but no solids. Patient will drink contrast when she arrives. Patient notified of appointment via My Chart.

## 2020-10-09 ENCOUNTER — Other Ambulatory Visit: Payer: Self-pay

## 2020-10-09 ENCOUNTER — Ambulatory Visit (INDEPENDENT_AMBULATORY_CARE_PROVIDER_SITE_OTHER)
Admission: RE | Admit: 2020-10-09 | Discharge: 2020-10-09 | Disposition: A | Discharge status: Home / Self Care | Payer: Managed Care, Other (non HMO) | Source: Ambulatory Visit | Attending: Gastroenterology | Admitting: Gastroenterology

## 2020-10-09 ENCOUNTER — Encounter (HOSPITAL_COMMUNITY): Payer: Self-pay | Admitting: Psychiatry

## 2020-10-09 ENCOUNTER — Telehealth (INDEPENDENT_AMBULATORY_CARE_PROVIDER_SITE_OTHER): Payer: 59 | Admitting: Psychiatry

## 2020-10-09 DIAGNOSIS — F331 Major depressive disorder, recurrent, moderate: Secondary | ICD-10-CM | POA: Diagnosis not present

## 2020-10-09 DIAGNOSIS — R1084 Generalized abdominal pain: Secondary | ICD-10-CM

## 2020-10-09 DIAGNOSIS — K529 Noninfective gastroenteritis and colitis, unspecified: Secondary | ICD-10-CM

## 2020-10-09 DIAGNOSIS — F411 Generalized anxiety disorder: Secondary | ICD-10-CM | POA: Diagnosis not present

## 2020-10-09 MED ORDER — ESCITALOPRAM OXALATE 20 MG PO TABS: 20.0000 mg | ORAL_TABLET | Freq: Every day | ORAL | 4 refills | Status: DC

## 2020-10-09 MED ORDER — IOHEXOL 300 MG/ML  SOLN
100.0000 mL | Freq: Once | INTRAMUSCULAR | Status: AC | PRN
  Administered 2020-10-09: 100 mL via INTRAVENOUS

## 2020-10-09 NOTE — Progress Notes (Signed)
[facility] Follow up visit   Patient Identification: Carol Manning MRN:  [id number] Date of Evaluation:  10/09/2020 Referral Source: Primary care, [facility] urgent care  Chief Complaint: follow up depression Visit Diagnosis:    ICD-10-CM   1. MDD (major depressive disorder), recurrent episode, moderate (HCC)  F33.1   2. GAD (generalized anxiety disorder)  F41.1    Virtual Visit via Video Note  I connected with Carol Manning on 10/09/20 at  3:30 PM EDT by a video enabled telemedicine application and verified that I am speaking with the correct person using two identifiers.  Location: Patient: home Provider: office   I discussed the limitations of evaluation and management by telemedicine and the availability of in person appointments. The patient expressed understanding and agreed to proceed.   I discussed the assessment and treatment plan with the patient. The patient was provided an opportunity to ask questions and all were answered. The patient agreed with the plan and demonstrated an understanding of the instructions.   The patient was advised to call back or seek an in-person evaluation if the symptoms worsen or if the condition fails to improve as anticipated.  I provided 10  minutes of non-face-to-face time during this encounter.       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 Anxiety   . Asthma   . Depression   . PCOS (polycystic ovarian syndrome)   . Sleep apnea     Past Surgical History:  Procedure Laterality Date  . HYMENECTOMY    . WISDOM TOOTH EXTRACTION Bilateral     Family Psychiatric History: denies, or not known, patient adapted  Family History:  Family History  Adopted: Yes  Family history unknown: Yes    Social History:   Social History   Socioeconomic History  . Marital status: Single    Spouse name: Not on file  . Number of children: Not on file  . Years of education: Not on file  . Highest education level: Not on file  Occupational History  . Not on file  Tobacco Use  . Smoking status: Never Smoker  . Smokeless tobacco: Never Used  Vaping Use  . Vaping Use: Never used  Substance and Sexual Activity  . Alcohol use: Never  . Drug use: Never  . Sexual activity: Never  Other Topics Concern  . Not on file  Social History Narrative  . Not on file   Social Determinants of Health   Financial Resource Strain: Not on file  Food Insecurity: Not on file  Transportation Needs: Not on file  Physical Activity: Not on file  Stress: Not on file  Social Connections: Not on file      Allergies:  No Known Allergies  Metabolic Disorder Labs: No results found for: HGBA1C, MPG No results found for: PROLACTIN Lab Results  Component Value Date   CHOL 192 07/11/2020   TRIG 142.0 07/11/2020   HDL 59.60 07/11/2020   CHOLHDL 3 07/11/2020   VLDL 28.4 07/11/2020   LDLCALC 104 (H) 07/11/2020   Lab Results  Component Value Date   TSH 2.85 07/11/2020    Therapeutic Level Labs: No results found for: LITHIUM No results found for: CBMZ No results found for: VALPROATE  Current Medications: Current Outpatient Medications  Medication Sig Dispense Refill  . diphenoxylate-atropine (LOMOTIL) 2.5-0.025 MG tablet Take 1 tablet by mouth 4 (four) times daily as needed for  diarrhea or loose stools. 65 tablet 2  . drospirenone-ethinyl estradiol (YAZ) 3-0.02 MG tablet Take 1 tablet by mouth daily.    Marland Kitchen escitalopram (LEXAPRO) 20 MG tablet Take 1 tablet (20 mg total) by mouth daily for 7 days. DAILY 30 tablet 4  . hyoscyamine (LEVSIN SL) 0.125 MG SL tablet Place 1 tablet (0.125 mg total) under the tongue every 6 (six) hours as needed. 45 tablet 0  . ondansetron (ZOFRAN) 4 MG tablet Take 1 tablet (4 mg total) by mouth every 8 (eight) hours as needed for nausea or vomiting. 30 tablet 2   No current facility-administered medications for this visit.       Psychiatric Specialty Exam: Review of Systems  Cardiovascular: Negative for chest pain.  Psychiatric/Behavioral: Negative for agitation and dysphoric mood.    There were no vitals taken for this visit.There is no height or weight on file to calculate BMI.  General Appearance: Casual  Eye Contact:  Fair  Speech:  Normal Rate  Volume: normal  Mood: fair  Affect:  Congruent  Thought Process:  Goal Directed  Orientation:  Full (Time, Place, and Person)  Thought Content:  Rumination  Suicidal Thoughts:  No  Homicidal Thoughts:  No  Memory:  Immediate;   Fair Recent;   Fair  Judgement:  Fair  Insight:  Shallow  Psychomotor Activity:  Decreased  Concentration:  Concentration: Fair and Attention Span: Fair  Recall:  [employer] of Knowledge:Good  Language: Good  Akathisia:  No  Handed:    AIMS (if indicated):  No involuntary movements  Assets:  Communication Skills Desire for Improvement Physical Health  ADL's:  Intact  Cognition: WNL  Sleep:  Fair   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 in [facility] at [employer]  PHQ-2 Total Score 0  PHQ-9 Total Score 0    Flowsheet Row Video [facility] from 07/27/2020 in [facility]   C-SSRS RISK CATEGORY No Risk No Risk       Assessment and Plan: as follows MDD recurrent moderate to severe: better on lexapro continue 20mg  Wants 5 months refill as insurance may change   Continue therapy her therapist Is Generalized anxiety disorder with panic attacks manageable, continue lexapro  Bulimia; working in therapy to [place]  Discussed and reviewed medication called in early if needed Fu 22m.    4m, MD 5/10/20223:41 PM

## 2020-10-11 ENCOUNTER — Encounter: Payer: Self-pay | Admitting: Nurse Practitioner

## 2020-10-11 ENCOUNTER — Other Ambulatory Visit: Payer: Self-pay

## 2020-10-11 ENCOUNTER — Other Ambulatory Visit (HOSPITAL_COMMUNITY)
Admission: RE | Admit: 2020-10-11 | Discharge: 2020-10-11 | Disposition: A | Discharge status: Home / Self Care | Payer: Managed Care, Other (non HMO) | Source: Ambulatory Visit | Attending: Nurse Practitioner | Admitting: Nurse Practitioner

## 2020-10-11 ENCOUNTER — Ambulatory Visit (INDEPENDENT_AMBULATORY_CARE_PROVIDER_SITE_OTHER): Payer: Managed Care, Other (non HMO) | Admitting: Nurse Practitioner

## 2020-10-11 VITALS — BP 118/74 | Ht 61.0 in | Wt 251.0 lb

## 2020-10-11 DIAGNOSIS — Z3041 Encounter for surveillance of contraceptive pills: Secondary | ICD-10-CM

## 2020-10-11 DIAGNOSIS — Z6837 Body mass index (BMI) 37.0-37.9, adult: Secondary | ICD-10-CM

## 2020-10-11 DIAGNOSIS — Z01419 Encounter for gynecological examination (general) (routine) without abnormal findings: Secondary | ICD-10-CM

## 2020-10-11 DIAGNOSIS — N3941 Urge incontinence: Secondary | ICD-10-CM | POA: Diagnosis not present

## 2020-10-11 MED ORDER — DROSPIRENONE-ETHINYL ESTRADIOL 3-0.02 MG PO TABS: 1.0000 | ORAL_TABLET | Freq: Every day | ORAL | 4 refills | Status: AC

## 2020-10-11 NOTE — Progress Notes (Signed)
25 y.o. G0P0000 Single Asian female here for annual exam.   Previous care in [facility] Doing well with Yaz  No LMP recorded. (Menstrual status: Oral contraceptives).  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 virginal The current method of family planning is OCP (estrogen/progesterone).    Exercising: Yes.    run Smoker:  no  Health Maintenance: Pap: 2018-norm History of abnormal Pap:  no MMG:  n/a Colonoscopy: n/a BMD: n/a Gardasil:yes Covid-19:Phizer Hep C testing:never Screening Labs:PCP   reports that she has never smoked. She has never used smokeless tobacco. She reports that she does not drink alcohol and does not use drugs.  Past Medical History:  Diagnosis Date  . Anxiety   . Asthma   . Depression   . PCOS (polycystic ovarian syndrome)   . Sleep apnea     Past Surgical History:  Procedure Laterality Date  . HYMENECTOMY    . WISDOM TOOTH EXTRACTION Bilateral     Current Outpatient Medications  Medication Sig Dispense Refill  . diphenoxylate-atropine (LOMOTIL) 2.5-0.025 MG tablet Take 1 tablet by mouth 4 (four) times daily as needed for diarrhea or loose stools. 65 tablet 2  . drospirenone-ethinyl estradiol (YAZ) 3-0.02 MG tablet Take 1 tablet by mouth daily.    Marland Kitchen escitalopram (LEXAPRO) 20 MG tablet Take 1 tablet (20 mg total) by mouth daily for 7 days. DAILY 30 tablet 4  . hyoscyamine (LEVSIN SL) 0.125 MG SL tablet Place 1 tablet (0.125 mg total) under the tongue every 6 (six) hours as needed. 45 tablet 0  . ondansetron (ZOFRAN) 4 MG tablet Take 1 tablet (4 mg total) by mouth every 8 (eight) hours as needed for nausea or vomiting. 30 tablet 2   No current facility-administered medications for this visit.    Family History  Adopted: Yes  Family history unknown: Yes    Review of Systems  All other  systems reviewed and are negative.   Exam:   BP 118/74   Ht 5\' 1"  (1.549 m)   Wt 251 lb (113.9 kg)   BMI 47.43 kg/m   Height: 5\' 1"  (154.9 cm)  General appearance: alert, cooperative and appears stated age, no acute distress Head: Normocephalic, without obvious abnormality Neck: no adenopathy, thyroid normal to inspection and palpation Lungs: clear to auscultation bilaterally Breasts: No axillary or supraclavicular adenopathy, Normal to palpation without dominant masses Heart: regular rate and rhythm Abdomen: soft, non-tender; no masses,  no organomegaly Extremities: extremities normal, no edema Skin: No rashes or lesions Lymph nodes: Cervical, supraclavicular, and axillary nodes normal. No abnormal inguinal nodes palpated Neurologic: Grossly normal   Pelvic: External genitalia:  no lesions              Urethra:  normal appearing urethra with no masses, tenderness or lesions              Bartholins and Skenes: normal                 Vagina: normal appearing vagina, appropriate for age, normal appearing discharge, no lesions              Cervix: neg cervical motion tenderness, no visible lesions             Bimanual Exam:   Uterus:  normal size, contour, position, consistency, mobility, non-tender  Adnexa: no mass, fullness, tenderness                 Kim, CMA Chaperone was present for exam.  A:  Well woman exam - Plan: Cytology - PAP( Story)  Surveillance of contraceptive pill - Plan: drospirenone-ethinyl estradiol (YAZ) 3-0.02 MG tablet  Urge incontinence-discussed, recommended Kegels, offered referral urodynamics, pt wants to hold off for now  BMI 37.0-37.9, adult-referral to weight loss center    P:   Pap :collected today  Labs:n/a, comprehensive labs drawn in February  Medications: Yaz, continuous dosing

## 2020-10-11 NOTE — Patient Instructions (Addendum)
Dr. Ethelene Browns ([facility])   Health Maintenance, Female Adopting a healthy lifestyle and getting preventive care are important in promoting health and wellness. Ask your health care provider about:  The right schedule for you to have regular tests and exams.  Things you can do on your own to prevent diseases and keep yourself healthy. What should I know about diet, weight, and exercise? Eat a healthy diet  Eat a diet that includes plenty of vegetables, fruits, low-fat dairy products, and lean protein.  Do not eat a lot of foods that are high in solid fats, added sugars, or sodium.   Maintain a healthy weight Body mass index (BMI) is used to identify weight problems. It estimates body fat based on height and weight. Your health care provider can help determine your BMI and help you achieve or maintain a healthy weight. Get regular exercise Get regular exercise. This is one of the most important things you can do for your health. Most adults should:  Exercise for at least 150 minutes each week. The exercise should increase your heart rate and make you sweat (moderate-intensity exercise).  Do strengthening exercises at least twice a week. This is in addition to the moderate-intensity exercise.  Spend less time sitting. Even light physical activity can be beneficial. Watch cholesterol and blood lipids Have your blood tested for lipids and cholesterol at 26 years of age, then have this test every 5 years. Have your cholesterol levels checked more often if:  Your lipid or cholesterol levels are high.  You are older than 26 years of age.  You are at high risk for heart disease. What should I know about cancer screening? Depending on your health history and family history, you may need to have cancer screening at various ages. This may include screening for:  Breast cancer.  Cervical cancer.  Colorectal cancer.  Skin cancer.  Lung cancer. What should I know about heart disease,  diabetes, and high blood pressure? Blood pressure and heart disease  High blood pressure causes heart disease and increases the risk of stroke. This is more likely to develop in people who have high blood pressure readings, are of African descent, or are overweight.  Have your blood pressure checked: ? Every 3-5 years if you are 60-38 years of age. ? Every year if you are 42 years old or older. Diabetes Have regular diabetes screenings. This checks your fasting blood sugar level. Have the screening done:  Once every three years after age 27 if you are at a normal weight and have a low risk for diabetes.  More often and at a younger age if you are overweight or have a high risk for diabetes. What should I know about preventing infection? Hepatitis B If you have a higher risk for hepatitis B, you should be screened for this virus. Talk with your health care provider to find out if you are at risk for hepatitis B infection. Hepatitis C Testing is recommended for:  Everyone born from [birth year] through 1966.  Anyone with known risk factors for hepatitis C. Sexually transmitted infections (STIs)  Get screened for STIs, including gonorrhea and chlamydia, if: ? You are sexually active and are younger than 26 years of age. ? You are older than 26 years of age and your health care provider tells you that you are at risk for this type of infection. ? Your sexual activity has changed since you were last screened, and you are at increased risk for chlamydia or  gonorrhea. Ask your health care provider if you are at risk.  Ask your health care provider about whether you are at high risk for HIV. Your health care provider may recommend a prescription medicine to help prevent HIV infection. If you choose to take medicine to prevent HIV, you should first get tested for HIV. You should then be tested every 3 months for as long as you are taking the medicine. Pregnancy  If you are about to stop having your  period (premenopausal) and you may become pregnant, seek counseling before you get pregnant.  Take 400 to 800 micrograms (mcg) of folic acid every day if you become pregnant.  Ask for birth control (contraception) if you want to prevent pregnancy. Osteoporosis and menopause Osteoporosis is a disease in which the bones lose minerals and strength with aging. This can result in bone fractures. If you are 39 years old or older, or if you are at risk for osteoporosis and fractures, ask your health care provider if you should:  Be screened for bone loss.  Take a calcium or vitamin D supplement to lower your risk of fractures.  Be given hormone replacement therapy (HRT) to treat symptoms of menopause. Follow these instructions at home: Lifestyle  Do not use any products that contain nicotine or tobacco, such as cigarettes, e-cigarettes, and chewing tobacco. If you need help quitting, ask your health care provider.  Do not use street drugs.  Do not share needles.  Ask your health care provider for help if you need support or information about quitting drugs. Alcohol use  Do not drink alcohol if: ? Your health care provider tells you not to drink. ? You are pregnant, may be pregnant, or are planning to become pregnant.  If you drink alcohol: ? Limit how much you use to 0-1 drink a day. ? Limit intake if you are breastfeeding.  Be aware of how much alcohol is in your drink. In the [place] one drink equals one 12 oz bottle of beer (355 mL), one 5 oz glass of wine (148 mL), or one 1 oz glass of hard liquor (44 mL). General instructions  Schedule regular health, dental, and eye exams.  Stay current with your vaccines.  Tell your health care provider if: ? You often feel depressed. ? You have ever been abused or do not feel safe at home. Summary  Adopting a healthy lifestyle and getting preventive care are important in promoting health and wellness.  Follow your health care provider's  instructions about healthy diet, exercising, and getting tested or screened for diseases.  Follow your health care provider's instructions on monitoring your cholesterol and blood pressure. This information is not intended to replace advice given to you by your health care provider. Make sure you discuss any questions you have with your health care provider. Document Revised: 05/12/2018 Document Reviewed: 05/12/2018 Elsevier Patient Education  2021 Elsevier Inc.  Kegel Exercises  Kegel exercises can help strengthen your pelvic floor muscles. The pelvic floor is a group of muscles that support your rectum, small intestine, and bladder. In females, pelvic floor muscles also help support the womb (uterus). These muscles help you control the flow of urine and stool. Kegel exercises are painless and simple, and they do not require any equipment. Your provider may suggest Kegel exercises to:  Improve bladder and bowel control.  Improve sexual response.  Improve weak pelvic floor muscles after surgery to remove the uterus (hysterectomy) or pregnancy (females).  Improve weak pelvic floor  muscles after prostate gland removal or surgery (males). Kegel exercises involve squeezing your pelvic floor muscles, which are the same muscles you squeeze when you try to stop the flow of urine or keep from passing gas. The exercises can be done while sitting, standing, or lying down, but it is best to vary your position. Exercises How to do Kegel exercises: 1. Squeeze your pelvic floor muscles tight. You should feel a tight lift in your rectal area. If you are a female, you should also feel a tightness in your vaginal area. Keep your stomach, buttocks, and legs relaxed. 2. Hold the muscles tight for up to 10 seconds. 3. Breathe normally. 4. Relax your muscles. 5. Repeat as told by your health care provider. Repeat this exercise daily as told by your health care provider. Continue to do this exercise for at  least 4-6 weeks, or for as long as told by your health care provider. You may be referred to a physical therapist who can help you learn more about how to do Kegel exercises. Depending on your condition, your health care provider may recommend:  Varying how long you squeeze your muscles.  Doing several sets of exercises every day.  Doing exercises for several weeks.  Making Kegel exercises a part of your regular exercise routine. This information is not intended to replace advice given to you by your health care provider. Make sure you discuss any questions you have with your health care provider. Document Revised: 09/23/2019 Document Reviewed: 01/06/2018 Elsevier Patient Education  2021 [employer].

## 2020-10-12 LAB — CYTOLOGY - PAP: Diagnosis: NEGATIVE

## 2020-10-15 ENCOUNTER — Telehealth: Payer: Self-pay | Admitting: *Deleted

## 2020-10-15 DIAGNOSIS — Z6837 Body mass index (BMI) 37.0-37.9, adult: Secondary | ICD-10-CM

## 2020-10-15 DIAGNOSIS — Z8659 Personal history of other mental and behavioral disorders: Secondary | ICD-10-CM

## 2020-10-15 NOTE — Telephone Encounter (Signed)
-----   Message from Clarita Crane, NP sent at 10/11/2020  2:57 PM EDT ----- This patient has gained 45 lbs this year. She would like help with weight loss. She has a history of Bulimia. Can you make a referral for her to [facility]. Her insurance will change at the end of the month. Is it possible for her to get in by end of May? Thanks

## 2020-10-29 ENCOUNTER — Other Ambulatory Visit: Payer: Self-pay | Admitting: Gastroenterology

## 2020-12-05 NOTE — Telephone Encounter (Signed)
[facility] with [facility]. Patient declined appointment. Encounter closed.

## 2021-03-18 ENCOUNTER — Encounter (HOSPITAL_COMMUNITY): Payer: Self-pay | Admitting: Psychiatry

## 2021-03-18 ENCOUNTER — Telehealth (INDEPENDENT_AMBULATORY_CARE_PROVIDER_SITE_OTHER): Payer: Self-pay | Admitting: Psychiatry

## 2021-03-18 DIAGNOSIS — F900 Attention-deficit hyperactivity disorder, predominantly inattentive type: Secondary | ICD-10-CM

## 2021-03-18 DIAGNOSIS — F331 Major depressive disorder, recurrent, moderate: Secondary | ICD-10-CM

## 2021-03-18 DIAGNOSIS — F411 Generalized anxiety disorder: Secondary | ICD-10-CM

## 2021-03-18 MED ORDER — DEXMETHYLPHENIDATE HCL ER 10 MG PO CP24: 10.0000 mg | ORAL_CAPSULE | Freq: Every day | ORAL | 0 refills | Status: AC

## 2021-03-18 MED ORDER — ESCITALOPRAM OXALATE 20 MG PO TABS: 20.0000 mg | ORAL_TABLET | Freq: Every day | ORAL | 1 refills | Status: DC

## 2021-03-18 NOTE — Progress Notes (Signed)
[facility] Follow up visit   Patient Identification: Carol Manning MRN:  [id number] Date of Evaluation:  03/18/2021 Referral Source: Primary care, [facility] urgent care  Chief Complaint: follow up depression Visit Diagnosis:    ICD-10-CM   1. MDD (major depressive disorder), recurrent episode, moderate (HCC)  F33.1     2. GAD (generalized anxiety disorder)  F41.1     3. Attention deficit hyperactivity disorder (ADHD), predominantly inattentive type  F90.0      Virtual Visit via Video Note  I connected with [name] on 03/18/21 at  3:30 PM EDT by a video enabled telemedicine application and verified that I am speaking with the correct person using two identifiers.  Location: Patient: work Provider: home office   I discussed the limitations of evaluation and management by telemedicine and the availability of in person appointments. The patient expressed understanding and agreed to proceed.     I discussed the assessment and treatment plan with the patient. The patient was provided an opportunity to ask questions and all were answered. The patient agreed with the plan and demonstrated an understanding of the instructions.   The patient was advised to call back or seek an in-person evaluation if the symptoms worsen or if the condition fails to improve as anticipated.  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  She feels Lexapro has been helping the depression and anxiety  She feels out of focus at times and distracted states she was on Focalin 20 mg XR when she was in high school and was struggling with inattention at that time     Aggravating factor: Being adopted, feels distant from her culture , difficult  relationship with adapted parents Modifying factors: New job     Past Psychiatric History: depression, anxiety  Previous Psychotropic Medications: Yes    Past Medical History:  Past Medical History:  Diagnosis Date   Anxiety    Asthma    Depression    PCOS (polycystic ovarian syndrome)    Sleep apnea     Past Surgical History:  Procedure Laterality Date   HYMENECTOMY     WISDOM TOOTH EXTRACTION Bilateral     Family Psychiatric History: denies, or not known, patient adapted  Family History:  Family History  Adopted: Yes  Family history unknown: Yes    Social History:   Social History   Socioeconomic History   Marital status: Single    Spouse name: Not on file   Number of children: Not on file   Years of education: Not on file   Highest education level: Not on file  Occupational History   Not on file  Tobacco Use   Smoking status: Never   Smokeless tobacco: Never  Vaping Use   Vaping Use: Never used  Substance and Sexual Activity   Alcohol use: Never   Drug use: Never   Sexual activity: Never    Birth control/protection: Pill    Comment: Virgin  Other Topics Concern   Not on file  Social History Narrative   Not on file   Social Determinants of Health   Financial Resource Strain: Not on file  Food Insecurity: Not on file  Transportation Needs: Not on file  Physical Activity: Not on file  Stress: Not on file  Social Connections: Not on file      Allergies:  No Known Allergies  Metabolic Disorder Labs: No results found for: HGBA1C, MPG No results found for: PROLACTIN Lab Results  Component Value Date   CHOL 192 07/11/2020   TRIG 142.0 07/11/2020   HDL 59.60 07/11/2020   CHOLHDL 3 07/11/2020   VLDL 28.4 07/11/2020   LDLCALC 104 (H) 07/11/2020   Lab Results  Component Value Date   TSH 2.85 07/11/2020    Therapeutic Level Labs: No results found for: LITHIUM No results found for: CBMZ No results found for: VALPROATE  Current  Medications: Current Outpatient Medications  Medication Sig Dispense Refill   dexmethylphenidate (FOCALIN XR) 10 MG 24 hr capsule Take 1 capsule (10 mg total) by mouth daily. 30 capsule 0   diphenoxylate-atropine (LOMOTIL) 2.5-0.025 MG tablet Take 1 tablet by mouth 4 (four) times daily as needed for diarrhea or loose stools. 65 tablet 2   drospirenone-ethinyl estradiol (YAZ) 3-0.02 MG tablet Take 1 tablet by mouth daily. Take continuous 84 tablet 4   escitalopram (LEXAPRO) 20 MG tablet Take 1 tablet (20 mg total) by mouth daily for 7 days. DAILY 30 tablet 1   hyoscyamine (LEVSIN SL) 0.125 MG SL tablet DISSOLVE 1 TABLET(0.125 MG) UNDER THE TONGUE EVERY 6 HOURS AS NEEDED 45 tablet 2   ondansetron (ZOFRAN) 4 MG tablet Take 1 tablet (4 mg total) by mouth every 8 (eight) hours as needed for nausea or vomiting. 30 tablet 2   No current facility-administered medications for this visit.       Psychiatric Specialty Exam: Review of Systems  Cardiovascular:  Negative for chest pain.  Psychiatric/Behavioral:  Negative for agitation and dysphoric mood.    There were no vitals taken for this visit.There is no height or weight on file to calculate BMI.  General Appearance: Casual  Eye Contact:  Fair  Speech:  Normal Rate  Volume: normal  Mood: fair  Affect:  Congruent  Thought Process:  Goal Directed  Orientation:  Full (Time, Place, and Person)  Thought Content:  Rumination  Suicidal Thoughts:  No  Homicidal Thoughts:  No  Memory:  Immediate;   Fair Recent;   Fair  Judgement:  Fair  Insight:  Shallow  Psychomotor Activity:  Decreased  Concentration:  Concentration: Fair and Attention Span: Fair  Recall:  [employer] of Knowledge:Good  Language: Good  Akathisia:  No  Handed:    AIMS (if indicated):  No involuntary movements  Assets:  Communication Skills Desire for Improvement Physical Health  ADL's:  Intact  Cognition: WNL  Sleep:  Fair   Screenings: Oceanographer Row  Office Visit from 07/11/2020 in [facility] at [employer]  PHQ-2 Total Score 0  PHQ-9 Total Score 0      Flowsheet Row Video Visit [facility]18[facility]2021 [facility] [place] Video Visit from 10[facility]09/2020 in [facility] [place] ED from 07/27/2020 in [facility]   C-SSRS RISK CATEGORY No Risk No Risk No Risk       Assessment and Plan: as follows  Prior documentation reviewed MDD recurrent moderate to severe: Improved continue Lexapro not feeling hopeless or suicidal  Continue therapy Generalized anxiety disorder with panic attacks improved on Lexapro continue  bulimia; working in therapy to distract,continue lexapro ADHD; inattentive type; states that she was on Focalin 20 mg she  was in high school she wants to get back on Focalin because of her concern about distraction and work hours We will start a small dose of 10 mg XR  Patient plans to find a local psychiatrist since she has moved to [place] we will send 1 refill next month as well and patient can be discharged she plans to follow-up will make an appointment with a local psychiatrist  Questions addressed she can call for concerns or refill prior to having a local appointment there    Thresa Ross, MD 10/17/20223:44 PM

## 2021-05-24 ENCOUNTER — Other Ambulatory Visit (HOSPITAL_COMMUNITY): Payer: Self-pay | Admitting: Psychiatry

## 2021-10-11 IMAGING — CT CT ENTEROGRAPHY (ABD-PELV W/ CM)
2 of 5 series · 16 of 46 positions shown, 18 images · IV contrast (omnipaque)
Comparison: None.

CLINICAL DATA: Generalized abdominal pain, diarrhea, no cause
identified by endoscopy and colonoscopy. Rule out Crohn's disease.

EXAM:
CT ABDOMEN AND PELVIS WITH CONTRAST (ENTEROGRAPHY)
TECHNIQUE: Multidetector CT of the abdomen and pelvis during bolus
administration of intravenous contrast. Negative oral contrast was
given.
CONTRAST:  100mL OMNIPAQUE IOHEXOL 300 MG/ML SOLN, additional
negative oral enteric contrast

[Series 4: entero thins · axial · 0.72mm/px · z∈[-515,-71]mm · 13 of 248 slices shown, 15 images]
[im 13/248  soft-tissue]
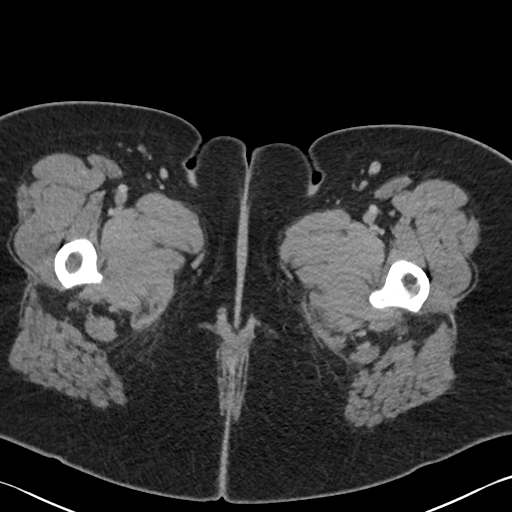
[im 13/248  bone]
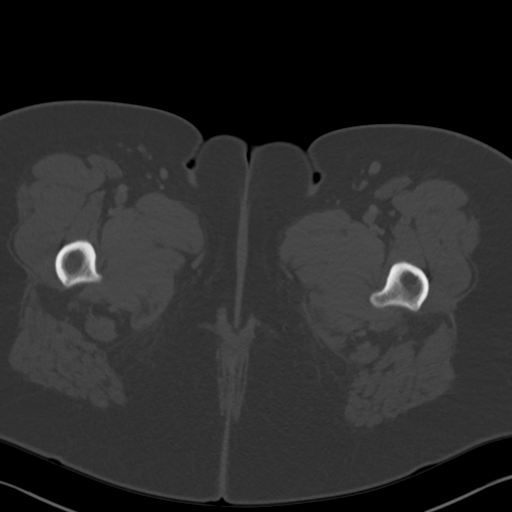
[im 38/248  soft-tissue]
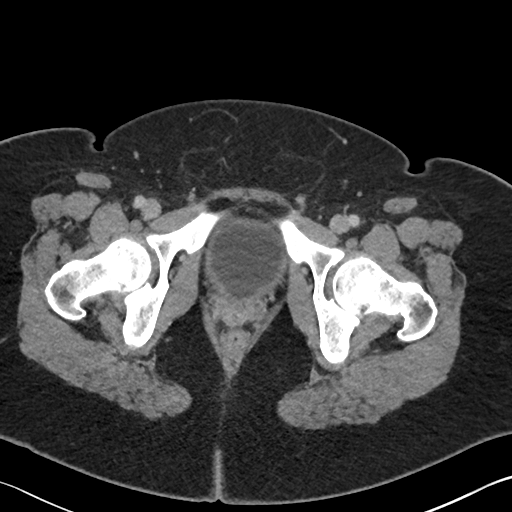
[im 50/248  soft-tissue]
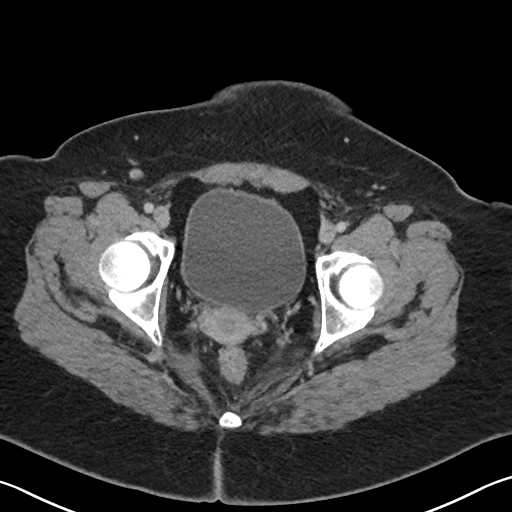
[im 75/248  soft-tissue]
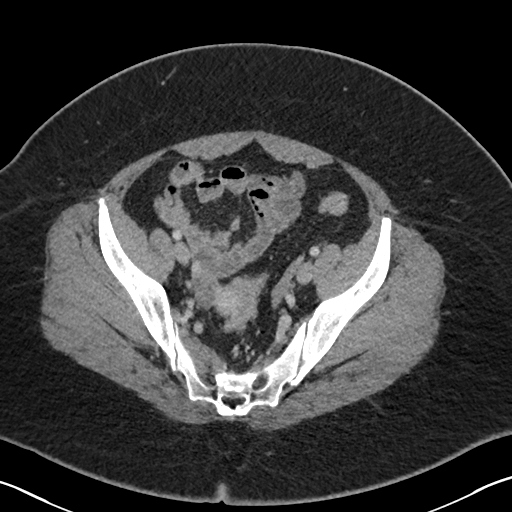
[im 87/248  soft-tissue]
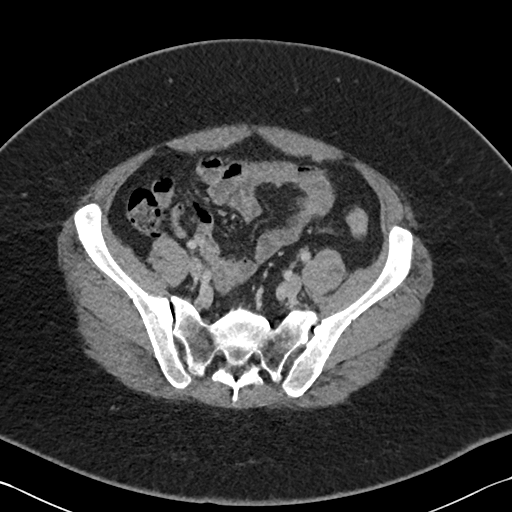
[im 112/248  soft-tissue]
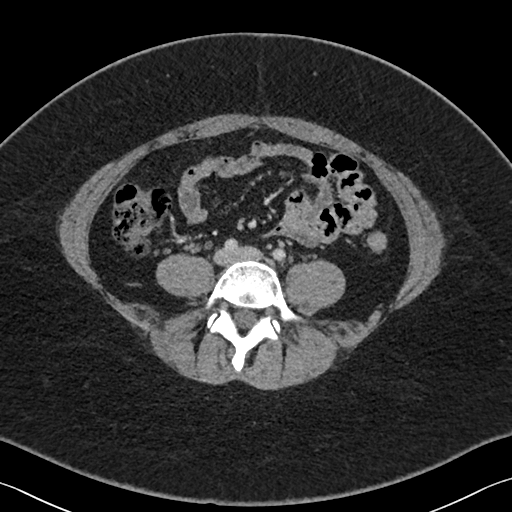
[im 124/248  soft-tissue]
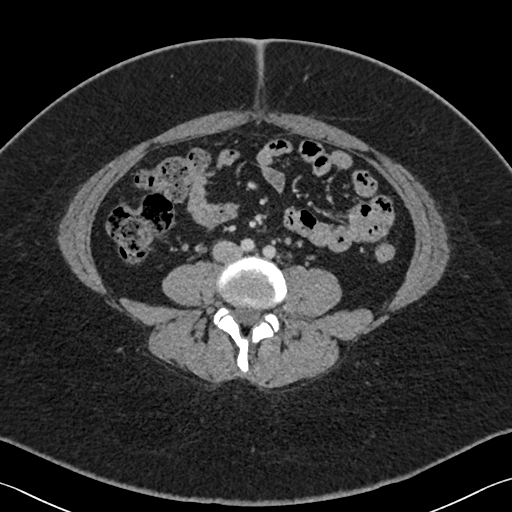
[im 136/248  soft-tissue]
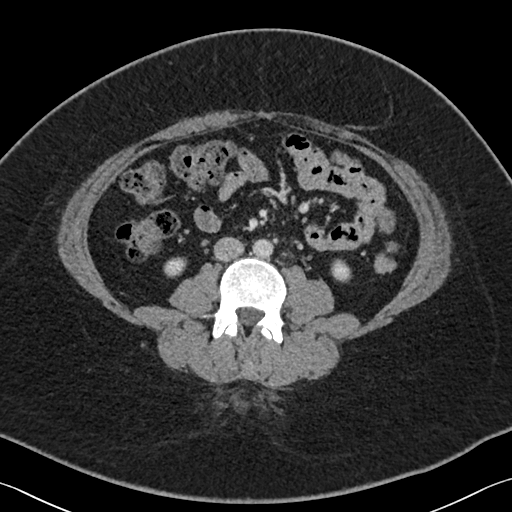
[im 161/248  soft-tissue]
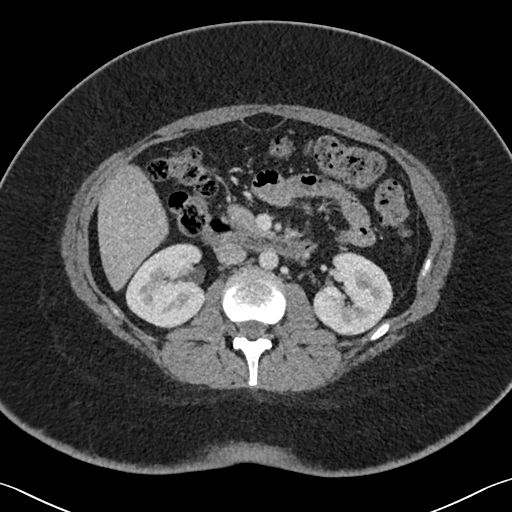
[im 161/248  bone]
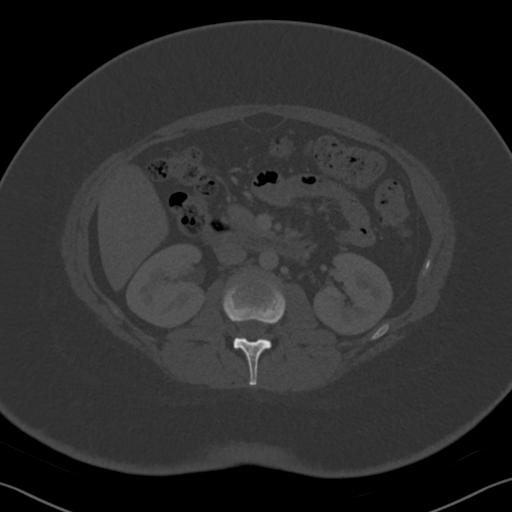
[im 173/248  soft-tissue]
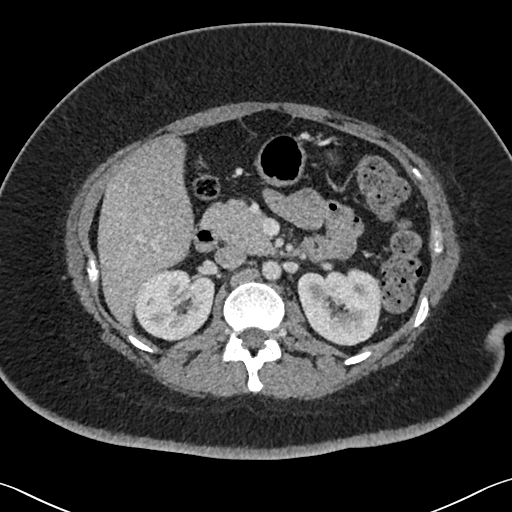
[im 198/248  soft-tissue]
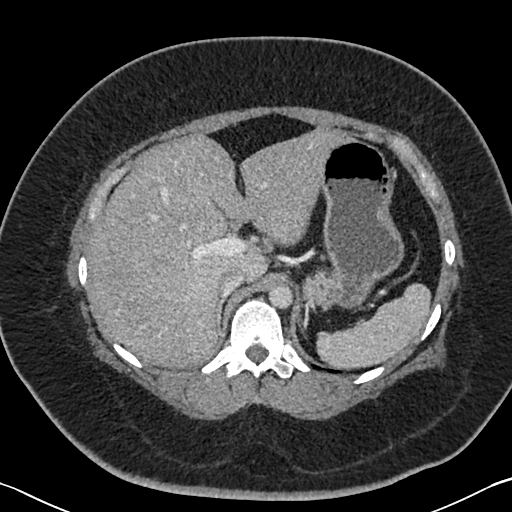
[im 210/248  soft-tissue]
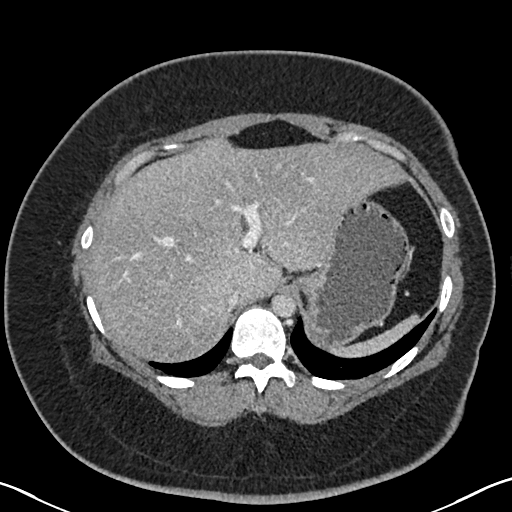
[im 235/248  soft-tissue]
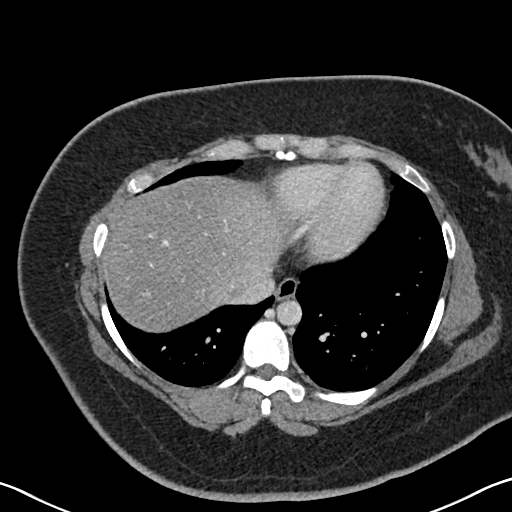

[Series 6: coronal · coronal · 0.72mm/px · 3 of 84 slices shown]
[im 28/84  soft-tissue]
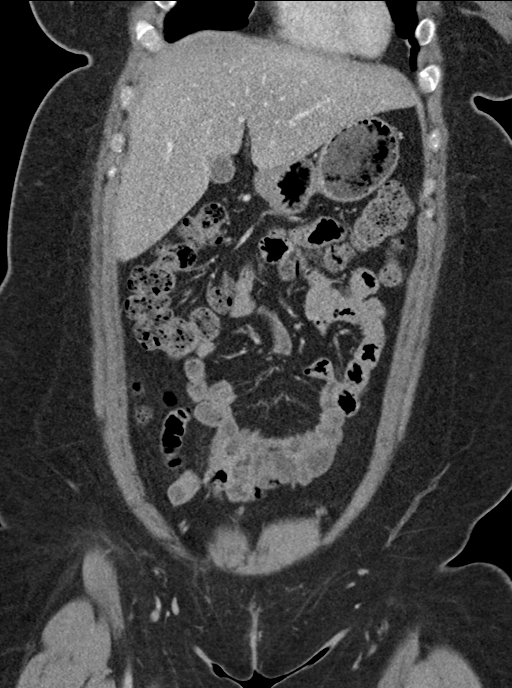
[im 37/84  soft-tissue]
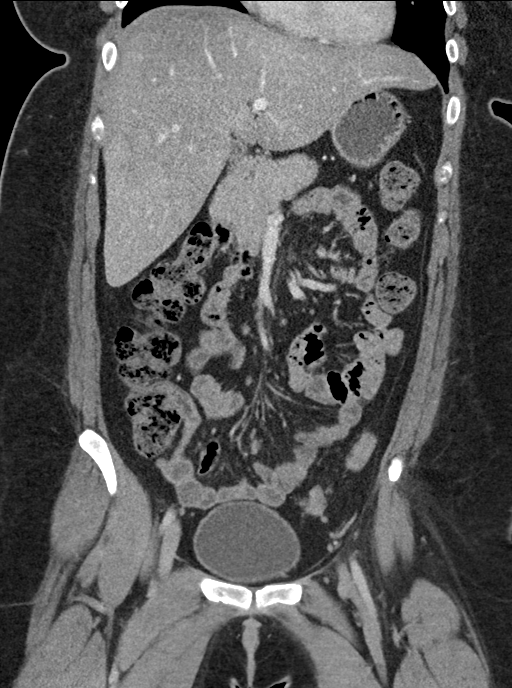
[im 47/84  soft-tissue]
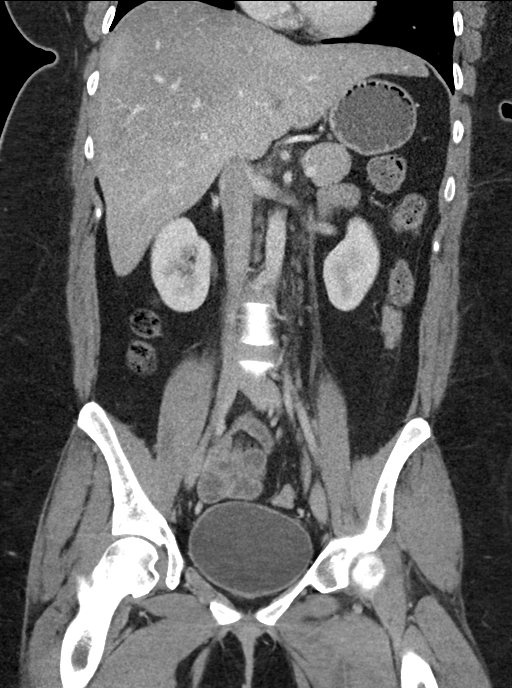

[16 of 46 positions shown; findings below may reference images not displayed]

FINDINGS: Lower chest: No acute abnormality.

Hepatobiliary: No solid liver abnormality is seen. Hepatic
steatosis. No gallstones, gallbladder wall thickening, or biliary
dilatation.

Pancreas: Unremarkable. No pancreatic ductal dilatation or
surrounding inflammatory changes.

Spleen: Normal in size without significant abnormality.

Adrenals/Urinary Tract: Adrenal glands are unremarkable. Kidneys are
normal, without renal calculi, solid lesion, or hydronephrosis.
Bladder is unremarkable.

Stomach/Bowel: Stomach is within normal limits. The small bowel is
underdistended, somewhat limiting assessment. No evident
inflammatory findings within this limitation. Appendix appears
normal. No evidence of bowel wall thickening, distention, or
inflammatory changes.

Vascular/Lymphatic: No significant vascular findings are present. No
enlarged abdominal or pelvic lymph nodes.

Reproductive: No mass or other significant abnormality.

Other: No abdominal wall hernia or abnormality. No abdominopelvic
ascites.

Musculoskeletal: No acute or significant osseous findings.
IMPRESSION: 1. The small bowel is underdistended, somewhat limiting assessment.
No evident inflammatory findings within this limitation.
2. Hepatic steatosis.

## 2023-07-30 ENCOUNTER — Encounter: Payer: Self-pay | Admitting: Gastroenterology
